# Patient Record
Sex: Male | Born: 1975 | Race: White | Hispanic: No | Marital: Single | Smoking: Current every day smoker
Health system: Southern US, Community
[De-identification: ages and names within clinical notes are randomized; demographics above are authoritative.]

## PROBLEM LIST (undated history)

## (undated) DIAGNOSIS — F32A Depression, unspecified: Secondary | ICD-10-CM

## (undated) DIAGNOSIS — G4730 Sleep apnea, unspecified: Secondary | ICD-10-CM

## (undated) DIAGNOSIS — F419 Anxiety disorder, unspecified: Secondary | ICD-10-CM

## (undated) HISTORY — PX: ABDOMINAL SURGERY: SHX537

---

## 2008-02-19 ENCOUNTER — Inpatient Hospital Stay (HOSPITAL_COMMUNITY): Admission: AD | Admit: 2008-02-19 | Discharge: 2008-02-26 | Payer: Self-pay | Admitting: *Deleted

## 2008-02-19 ENCOUNTER — Ambulatory Visit: Payer: Self-pay | Admitting: *Deleted

## 2008-02-28 ENCOUNTER — Inpatient Hospital Stay (HOSPITAL_COMMUNITY): Admission: EM | Admit: 2008-02-28 | Discharge: 2008-03-09 | Payer: Self-pay | Admitting: *Deleted

## 2008-03-12 ENCOUNTER — Inpatient Hospital Stay (HOSPITAL_COMMUNITY): Admission: AD | Admit: 2008-03-12 | Discharge: 2008-03-24 | Payer: Self-pay | Admitting: Psychiatry

## 2008-04-21 ENCOUNTER — Inpatient Hospital Stay (HOSPITAL_COMMUNITY): Admission: RE | Admit: 2008-04-21 | Discharge: 2008-05-04 | Payer: Self-pay | Admitting: Psychiatry

## 2008-04-21 ENCOUNTER — Ambulatory Visit: Payer: Self-pay | Admitting: Psychiatry

## 2008-06-25 ENCOUNTER — Inpatient Hospital Stay (HOSPITAL_COMMUNITY): Admission: EM | Admit: 2008-06-25 | Discharge: 2008-07-05 | Payer: Self-pay | Admitting: Psychiatry

## 2008-06-25 ENCOUNTER — Ambulatory Visit: Payer: Self-pay | Admitting: Psychiatry

## 2008-08-06 ENCOUNTER — Inpatient Hospital Stay (HOSPITAL_COMMUNITY): Admission: RE | Admit: 2008-08-06 | Discharge: 2008-08-08 | Payer: Self-pay | Admitting: *Deleted

## 2008-08-06 ENCOUNTER — Ambulatory Visit: Payer: Self-pay | Admitting: *Deleted

## 2008-09-02 ENCOUNTER — Inpatient Hospital Stay (HOSPITAL_COMMUNITY): Admission: RE | Admit: 2008-09-02 | Discharge: 2008-09-08 | Payer: Self-pay | Admitting: Psychiatry

## 2008-09-02 ENCOUNTER — Ambulatory Visit: Payer: Self-pay | Admitting: Psychiatry

## 2008-09-09 ENCOUNTER — Inpatient Hospital Stay (HOSPITAL_COMMUNITY): Admission: RE | Admit: 2008-09-09 | Discharge: 2008-09-15 | Payer: Self-pay | Admitting: Psychiatry

## 2008-09-09 ENCOUNTER — Ambulatory Visit: Payer: Self-pay | Admitting: Psychiatry

## 2008-09-20 ENCOUNTER — Inpatient Hospital Stay (HOSPITAL_COMMUNITY): Admission: RE | Admit: 2008-09-20 | Discharge: 2008-10-07 | Payer: Self-pay | Admitting: Psychiatry

## 2008-10-01 ENCOUNTER — Other Ambulatory Visit: Payer: Self-pay | Admitting: *Deleted

## 2008-10-04 ENCOUNTER — Other Ambulatory Visit: Payer: Self-pay | Admitting: *Deleted

## 2008-10-13 ENCOUNTER — Other Ambulatory Visit: Payer: Self-pay | Admitting: Emergency Medicine

## 2008-10-14 ENCOUNTER — Ambulatory Visit: Payer: Self-pay | Admitting: Psychiatry

## 2008-10-14 ENCOUNTER — Inpatient Hospital Stay (HOSPITAL_COMMUNITY): Admission: RE | Admit: 2008-10-14 | Discharge: 2008-10-21 | Payer: Self-pay | Admitting: Psychiatry

## 2008-10-20 ENCOUNTER — Encounter (HOSPITAL_COMMUNITY): Payer: Self-pay | Admitting: Psychiatry

## 2008-10-25 ENCOUNTER — Emergency Department (HOSPITAL_COMMUNITY): Admission: EM | Admit: 2008-10-25 | Discharge: 2008-10-26 | Payer: Self-pay | Admitting: Emergency Medicine

## 2008-12-28 ENCOUNTER — Emergency Department (HOSPITAL_COMMUNITY): Admission: EM | Admit: 2008-12-28 | Discharge: 2008-12-29 | Payer: Self-pay | Admitting: Emergency Medicine

## 2009-02-12 ENCOUNTER — Emergency Department (HOSPITAL_COMMUNITY): Admission: EM | Admit: 2009-02-12 | Discharge: 2009-02-12 | Payer: Self-pay | Admitting: Emergency Medicine

## 2009-02-24 ENCOUNTER — Emergency Department (HOSPITAL_COMMUNITY): Admission: EM | Admit: 2009-02-24 | Discharge: 2009-02-25 | Payer: Self-pay | Admitting: Emergency Medicine

## 2009-03-27 ENCOUNTER — Emergency Department (HOSPITAL_COMMUNITY): Admission: EM | Admit: 2009-03-27 | Discharge: 2009-03-31 | Payer: Self-pay | Admitting: Emergency Medicine

## 2009-05-22 ENCOUNTER — Encounter: Payer: Self-pay | Admitting: Internal Medicine

## 2009-05-23 ENCOUNTER — Encounter: Payer: Self-pay | Admitting: Internal Medicine

## 2009-05-30 ENCOUNTER — Ambulatory Visit: Payer: Self-pay | Admitting: Internal Medicine

## 2009-05-30 DIAGNOSIS — R002 Palpitations: Secondary | ICD-10-CM

## 2009-05-30 DIAGNOSIS — I10 Essential (primary) hypertension: Secondary | ICD-10-CM

## 2009-05-30 DIAGNOSIS — F411 Generalized anxiety disorder: Secondary | ICD-10-CM | POA: Insufficient documentation

## 2009-06-01 ENCOUNTER — Telehealth: Payer: Self-pay | Admitting: Internal Medicine

## 2009-06-05 ENCOUNTER — Telehealth: Payer: Self-pay | Admitting: Internal Medicine

## 2009-07-05 ENCOUNTER — Telehealth: Payer: Self-pay | Admitting: Internal Medicine

## 2009-07-25 ENCOUNTER — Telehealth: Payer: Self-pay | Admitting: Internal Medicine

## 2009-09-13 ENCOUNTER — Emergency Department (HOSPITAL_COMMUNITY): Admission: EM | Admit: 2009-09-13 | Discharge: 2009-09-16 | Payer: Self-pay | Admitting: Emergency Medicine

## 2009-09-18 ENCOUNTER — Emergency Department (HOSPITAL_COMMUNITY): Admission: EM | Admit: 2009-09-18 | Discharge: 2009-09-22 | Payer: Self-pay | Admitting: Emergency Medicine

## 2009-09-27 ENCOUNTER — Telehealth (INDEPENDENT_AMBULATORY_CARE_PROVIDER_SITE_OTHER): Payer: Self-pay | Admitting: *Deleted

## 2009-10-26 ENCOUNTER — Emergency Department (HOSPITAL_COMMUNITY): Admission: EM | Admit: 2009-10-26 | Discharge: 2009-10-28 | Payer: Self-pay | Admitting: Emergency Medicine

## 2009-11-24 ENCOUNTER — Emergency Department (HOSPITAL_COMMUNITY): Admission: EM | Admit: 2009-11-24 | Discharge: 2009-11-25 | Payer: Self-pay | Admitting: Emergency Medicine

## 2009-11-25 ENCOUNTER — Ambulatory Visit: Payer: Self-pay | Admitting: Psychiatry

## 2010-01-16 ENCOUNTER — Emergency Department (HOSPITAL_COMMUNITY): Admission: EM | Admit: 2010-01-16 | Discharge: 2010-01-17 | Payer: Self-pay | Admitting: Emergency Medicine

## 2010-02-06 ENCOUNTER — Emergency Department (HOSPITAL_COMMUNITY): Admission: EM | Admit: 2010-02-06 | Discharge: 2010-02-07 | Payer: Self-pay | Admitting: Emergency Medicine

## 2010-02-14 ENCOUNTER — Ambulatory Visit: Payer: Self-pay | Admitting: Psychiatry

## 2010-03-12 ENCOUNTER — Telehealth (INDEPENDENT_AMBULATORY_CARE_PROVIDER_SITE_OTHER): Payer: Self-pay | Admitting: *Deleted

## 2010-05-18 ENCOUNTER — Encounter (INDEPENDENT_AMBULATORY_CARE_PROVIDER_SITE_OTHER): Payer: Self-pay | Admitting: *Deleted

## 2010-06-14 ENCOUNTER — Encounter (INDEPENDENT_AMBULATORY_CARE_PROVIDER_SITE_OTHER): Payer: Self-pay | Admitting: *Deleted

## 2010-08-29 ENCOUNTER — Emergency Department: Payer: Self-pay | Admitting: Emergency Medicine

## 2010-09-02 ENCOUNTER — Encounter (HOSPITAL_COMMUNITY): Payer: Self-pay | Admitting: Psychiatry

## 2010-09-11 NOTE — Letter (Signed)
Summary: Appointment - Missed  [facility], Main Office  [address]   [place], [place] [address]   Phone: [phone]  Fax: [phone]     May 18, 2010 MRN: [id number]   [facility] [address] [place], [place]  [address]   Dear Mr. Traynham,  Our records indicate you missed your appointment on  09.26.11 with Dr. Graciela Husbands .                                    It is very important that we reach you to reschedule this appointment. We look forward to participating in your health care needs. Please contact us at the number listed above at your earliest convenience to reschedule this appointment.     Sincerely,    Glass blower/designer

## 2010-09-11 NOTE — Letter (Signed)
Summary: Appointment - Missed  [facility], Main Office  [address]   [place], [place] [address]   Phone: [phone]  Fax: [phone]     June 14, 2010 MRN: [id number]   [facility] [address] [place], [place]  [address]   Dear [name],  Our records indicate you missed your appointment on 9-26-2011with Dr. Graciela Husbands .                                   It is very important that we reach you to reschedule this appointment. We look forward to participating in your health care needs. Please contact us at the number listed above at your earliest convenience to reschedule this appointment.     Sincerely,  Lorne Skeens  [facility] Scheduling Team

## 2010-09-11 NOTE — Progress Notes (Signed)
Summary: pt having palpitations  Phone Note Call from Patient Call back at Home Phone [phone] Call back at [phone]   Caller: Patient Reason for Call: Talk to Nurse, Talk to Doctor Summary of Call: pt still having palpitations and SOB and made an appt for Sept just was wondering should he be seen sooner Initial call taken by: Omer Jack,  March 12, 2010 1:30 PM  Follow-up for Phone Call        Phone Call Completed  PT CURRENTLY TAKING PROPANOLOL ER 80 MG 1 once daily  STARTED BY PMD  C/O HR  RUNNING AT 102  THE OTHER DAY WITH LITTLE ACTIVITY  HAS APPT ON SEPT 26 ,2011 AT 12:30 INFORMED IF S/S WORSEN TO CALL OFFICE  OR GO TO NEAREST ER FOR EVALUATION AND TX. PT VERBALIZED UNDERSTANIDING. Follow-up by: Scherrie Bateman, LPN,  March 12, 2010 2:05 PM  Additional Follow-up for Phone Call Additional follow up Details #1::        increae fluid intake and will see a s scheduled Additional Follow-up by: Nathen May, MD, [facility],  March 19, 2010 1:55 PM    Additional Follow-up for Phone Call Additional follow up Details #2::    spoke with pt, he is aware of dr Koren Bound recommendations. he will call with problems prior to his appt in sept Deliah Goody, RN  March 20, 2010 9:46 AM

## 2010-09-11 NOTE — Progress Notes (Signed)
  Phone Note From Other Clinic   Caller: [facility] Details for Reason: Pt.Information Initial call taken by: KM    Faxed Holter Monitor over to [phone] [facility]  September 27, 2009 8:26 AM

## 2010-09-17 ENCOUNTER — Telehealth (INDEPENDENT_AMBULATORY_CARE_PROVIDER_SITE_OTHER): Payer: Self-pay | Admitting: *Deleted

## 2010-09-18 ENCOUNTER — Ambulatory Visit: Payer: Medicare Other | Admitting: Internal Medicine

## 2010-09-27 NOTE — Progress Notes (Signed)
  Monitor faxed to [facility] @ [phone]..call back [phone] [facility]  September 17, 2010 10:31 AM

## 2010-10-23 ENCOUNTER — Encounter (INDEPENDENT_AMBULATORY_CARE_PROVIDER_SITE_OTHER): Payer: Self-pay | Admitting: *Deleted

## 2010-10-28 LAB — BASIC METABOLIC PANEL
Calcium: 9.7 mg/dL (ref 8.4–10.5)
Creatinine, Ser: 0.9 mg/dL (ref 0.4–1.5)
Glucose, Bld: 79 mg/dL (ref 70–99)
Sodium: 141 mEq/L (ref 135–145)

## 2010-10-28 LAB — RAPID URINE DRUG SCREEN, HOSP PERFORMED
Amphetamines: NOT DETECTED
Benzodiazepines: POSITIVE — AB
Cocaine: NOT DETECTED
Opiates: NOT DETECTED
Tetrahydrocannabinol: NOT DETECTED

## 2010-10-28 LAB — CBC
MCV: 86.3 fL (ref 78.0–100.0)
Platelets: 224 10*3/uL (ref 150–400)
RBC: 5.56 MIL/uL (ref 4.22–5.81)
RDW: 13.6 % (ref 11.5–15.5)
WBC: 10.8 10*3/uL — ABNORMAL HIGH (ref 4.0–10.5)

## 2010-10-28 LAB — DIFFERENTIAL
Basophils Relative: 1 % (ref 0–1)
Lymphocytes Relative: 23 % (ref 12–46)
Monocytes Absolute: 0.9 10*3/uL (ref 0.1–1.0)
Monocytes Relative: 8 % (ref 3–12)
Neutro Abs: 7.1 10*3/uL (ref 1.7–7.7)
Smear Review: ADEQUATE

## 2010-10-28 LAB — URINALYSIS, ROUTINE W REFLEX MICROSCOPIC
Glucose, UA: NEGATIVE mg/dL
Nitrite: NEGATIVE
Protein, ur: NEGATIVE mg/dL
Specific Gravity, Urine: 1.018 (ref 1.005–1.030)
Urobilinogen, UA: 0.2 mg/dL (ref 0.0–1.0)

## 2010-10-28 LAB — ETHANOL: Alcohol, Ethyl (B): <5 mg/dL (ref 0–10)

## 2010-10-29 LAB — DIFFERENTIAL
Basophils Absolute: 0.1 10*3/uL (ref 0.0–0.1)
Basophils Relative: 1 % (ref 0–1)
Monocytes Absolute: 0.9 10*3/uL (ref 0.1–1.0)

## 2010-10-29 LAB — COMPREHENSIVE METABOLIC PANEL
ALT: 32 U/L (ref 0–53)
AST: 19 U/L (ref 0–37)
GFR calc non Af Amer: >60 mL/min (ref >60)
Potassium: 3.6 mEq/L (ref 3.5–5.1)
Sodium: 140 mEq/L (ref 135–145)
Total Bilirubin: 0.7 mg/dL (ref 0.3–1.2)

## 2010-10-29 LAB — POCT CARDIAC MARKERS
Myoglobin, poc: 48.7 ng/mL (ref 12–200)
Troponin i, poc: <0.05 ng/mL (ref 0.00–0.09)

## 2010-10-29 LAB — CBC
Hemoglobin: 15.5 g/dL (ref 13.0–17.0)
MCHC: 33.2 g/dL (ref 30.0–36.0)
RDW: 13.6 % (ref 11.5–15.5)

## 2010-10-29 LAB — LIPASE, BLOOD: Lipase: 35 U/L (ref 11–59)

## 2010-10-30 LAB — POCT I-STAT, CHEM 8
Glucose, Bld: 132 mg/dL — ABNORMAL HIGH (ref 70–99)
HCT: 48 % (ref 39.0–52.0)
Hemoglobin: 16.3 g/dL (ref 13.0–17.0)
Potassium: 3.7 mEq/L (ref 3.5–5.1)
TCO2: 22 mmol/L (ref 0–100)

## 2010-10-30 LAB — CBC
Hemoglobin: 15.1 g/dL (ref 13.0–17.0)
MCHC: 33.4 g/dL (ref 30.0–36.0)
RBC: 5.26 MIL/uL (ref 4.22–5.81)
RDW: 13.8 % (ref 11.5–15.5)

## 2010-10-30 LAB — BASIC METABOLIC PANEL
BUN: 10 mg/dL (ref 6–23)
CO2: 21 mEq/L (ref 19–32)
Calcium: 9.7 mg/dL (ref 8.4–10.5)
Creatinine, Ser: 0.88 mg/dL (ref 0.4–1.5)
GFR calc Af Amer: >60 mL/min (ref >60)
Glucose, Bld: 90 mg/dL (ref 70–99)

## 2010-10-30 LAB — URINALYSIS, ROUTINE W REFLEX MICROSCOPIC
Glucose, UA: NEGATIVE mg/dL
Ketones, ur: >80 mg/dL — AB
Protein, ur: NEGATIVE mg/dL
Specific Gravity, Urine: 1.036 — ABNORMAL HIGH (ref 1.005–1.030)

## 2010-10-30 LAB — DIFFERENTIAL
Eosinophils Absolute: 0 10*3/uL (ref 0.0–0.7)
Eosinophils Relative: 0 % (ref 0–5)
Monocytes Relative: 6 % (ref 3–12)
Neutro Abs: 10.2 10*3/uL — ABNORMAL HIGH (ref 1.7–7.7)

## 2010-10-30 LAB — RAPID URINE DRUG SCREEN, HOSP PERFORMED
Barbiturates: NOT DETECTED
Benzodiazepines: POSITIVE — AB
Cocaine: NOT DETECTED

## 2010-10-30 LAB — POCT CARDIAC MARKERS
CKMB, poc: <1 ng/mL — ABNORMAL LOW (ref 1.0–8.0)
Troponin i, poc: <0.05 ng/mL (ref 0.00–0.09)

## 2010-10-30 NOTE — Letter (Signed)
Summary: Appointment - Missed  [facility], Main Office  [address]   [place], [place] [address]   Phone: [phone]  Fax: [phone]     October 23, 2010 MRN: [id number]   [facility] [address] [place], [place]  [address]   Dear [name],  Our records indicate you missed your appointment on 09-18-2010 with Dr. Graciela Husbands.                                    It is very important that we reach you to reschedule this appointment. We look forward to participating in your health care needs. Please contact us at the number listed above at your earliest convenience to reschedule this appointment.     Sincerely,       Lorne Skeens  [facility] Scheduling Team

## 2010-10-31 LAB — BASIC METABOLIC PANEL
BUN: 16 mg/dL (ref 6–23)
CO2: 25 mEq/L (ref 19–32)
Chloride: 104 mEq/L (ref 96–112)
Potassium: 4.4 mEq/L (ref 3.5–5.1)

## 2010-10-31 LAB — COMPREHENSIVE METABOLIC PANEL
Alkaline Phosphatase: 83 U/L (ref 39–117)
BUN: 15 mg/dL (ref 6–23)
CO2: 27 mEq/L (ref 19–32)
Chloride: 103 mEq/L (ref 96–112)
Glucose, Bld: 99 mg/dL (ref 70–99)
Potassium: 3.7 mEq/L (ref 3.5–5.1)
Total Bilirubin: 0.9 mg/dL (ref 0.3–1.2)

## 2010-10-31 LAB — CBC
HCT: 46.3 % (ref 39.0–52.0)
HCT: 49 % (ref 39.0–52.0)
Hemoglobin: 17 g/dL (ref 13.0–17.0)
MCHC: 33.7 g/dL (ref 30.0–36.0)
MCV: 86 fL (ref 78.0–100.0)
Platelets: 229 10*3/uL (ref 150–400)
WBC: 11.3 10*3/uL — ABNORMAL HIGH (ref 4.0–10.5)
WBC: 13.8 10*3/uL — ABNORMAL HIGH (ref 4.0–10.5)

## 2010-10-31 LAB — RAPID URINE DRUG SCREEN, HOSP PERFORMED
Barbiturates: NOT DETECTED
Cocaine: NOT DETECTED
Cocaine: NOT DETECTED
Opiates: NOT DETECTED
Opiates: NOT DETECTED

## 2010-10-31 LAB — DIFFERENTIAL
Basophils Absolute: 0.1 10*3/uL (ref 0.0–0.1)
Basophils Relative: 1 % (ref 0–1)
Eosinophils Absolute: 0.3 10*3/uL (ref 0.0–0.7)
Eosinophils Relative: 2 % (ref 0–5)
Lymphs Abs: 3.4 10*3/uL (ref 0.7–4.0)
Monocytes Absolute: 0.9 10*3/uL (ref 0.1–1.0)
Monocytes Relative: 7 % (ref 3–12)
Neutro Abs: 9.4 10*3/uL — ABNORMAL HIGH (ref 1.7–7.7)
Neutrophils Relative %: 69 % (ref 43–77)

## 2010-10-31 LAB — CK TOTAL AND CKMB (NOT AT ARMC)
CK, MB: 0.6 ng/mL (ref 0.3–4.0)
Total CK: 69 U/L (ref 7–232)

## 2010-10-31 LAB — URINALYSIS, ROUTINE W REFLEX MICROSCOPIC
Glucose, UA: NEGATIVE mg/dL
pH: 6.5 (ref 5.0–8.0)

## 2010-10-31 LAB — ETHANOL
Alcohol, Ethyl (B): 5 mg/dL (ref 0–10)
Alcohol, Ethyl (B): <5 mg/dL (ref 0–10)

## 2010-10-31 LAB — TROPONIN I: Troponin I: <0.01 ng/mL (ref 0.00–0.06)

## 2010-11-05 LAB — RAPID URINE DRUG SCREEN, HOSP PERFORMED
Amphetamines: NOT DETECTED
Barbiturates: NOT DETECTED
Benzodiazepines: POSITIVE — AB
Cocaine: NOT DETECTED
Opiates: NOT DETECTED
Tetrahydrocannabinol: NOT DETECTED

## 2010-11-05 LAB — DIFFERENTIAL
Basophils Absolute: 0.1 10*3/uL (ref 0.0–0.1)
Basophils Relative: 1 % (ref 0–1)
Lymphocytes Relative: 25 % (ref 12–46)
Neutro Abs: 5.8 10*3/uL (ref 1.7–7.7)
Neutrophils Relative %: 64 % (ref 43–77)

## 2010-11-05 LAB — CBC
Hemoglobin: 15.1 g/dL (ref 13.0–17.0)
Platelets: 239 10*3/uL (ref 150–400)
RDW: 13.9 % (ref 11.5–15.5)

## 2010-11-05 LAB — BASIC METABOLIC PANEL
BUN: 13 mg/dL (ref 6–23)
Calcium: 9.5 mg/dL (ref 8.4–10.5)
Creatinine, Ser: 0.93 mg/dL (ref 0.4–1.5)
GFR calc non Af Amer: >60 mL/min (ref >60)
Glucose, Bld: 95 mg/dL (ref 70–99)
Sodium: 140 mEq/L (ref 135–145)

## 2010-11-05 LAB — TRICYCLICS SCREEN, URINE: TCA Scrn: NOT DETECTED

## 2010-11-17 LAB — DIFFERENTIAL
Basophils Absolute: 0 K/uL (ref 0.0–0.1)
Basophils Relative: 0 % (ref 0–1)
Eosinophils Absolute: 0.3 10*3/uL (ref 0.0–0.7)
Eosinophils Relative: 3 % (ref 0–5)
Lymphocytes Relative: 25 % (ref 12–46)
Lymphs Abs: 2.8 10*3/uL (ref 0.7–4.0)
Monocytes Absolute: 0.8 K/uL (ref 0.1–1.0)
Monocytes Relative: 7 % (ref 3–12)
Neutro Abs: 7.5 K/uL (ref 1.7–7.7)
Neutrophils Relative %: 66 % (ref 43–77)

## 2010-11-17 LAB — POCT I-STAT, CHEM 8
BUN: 14 mg/dL (ref 6–23)
Calcium, Ion: 1.15 mmol/L (ref 1.12–1.32)
Chloride: 106 mEq/L (ref 96–112)
Creatinine, Ser: 1.1 mg/dL (ref 0.4–1.5)
Glucose, Bld: 114 mg/dL — ABNORMAL HIGH (ref 70–99)
HCT: 51 % (ref 39.0–52.0)
Hemoglobin: 17.3 g/dL — ABNORMAL HIGH (ref 13.0–17.0)
Potassium: 4.1 mEq/L (ref 3.5–5.1)
Sodium: 141 mEq/L (ref 135–145)
TCO2: 26 mmol/L (ref 0–100)

## 2010-11-17 LAB — RAPID URINE DRUG SCREEN, HOSP PERFORMED
Amphetamines: NOT DETECTED
Barbiturates: NOT DETECTED
Benzodiazepines: POSITIVE — AB
Cocaine: NOT DETECTED
Opiates: NOT DETECTED
Tetrahydrocannabinol: NOT DETECTED

## 2010-11-17 LAB — URINALYSIS, ROUTINE W REFLEX MICROSCOPIC
Bilirubin Urine: NEGATIVE
Glucose, UA: NEGATIVE mg/dL
Hgb urine dipstick: NEGATIVE
Ketones, ur: NEGATIVE mg/dL
Nitrite: NEGATIVE
Protein, ur: NEGATIVE mg/dL
Specific Gravity, Urine: 1.029 (ref 1.005–1.030)
Urobilinogen, UA: 1 mg/dL (ref 0.0–1.0)
pH: 6 (ref 5.0–8.0)

## 2010-11-17 LAB — CBC
HCT: 48.5 % (ref 39.0–52.0)
Hemoglobin: 16.3 g/dL (ref 13.0–17.0)
MCHC: 33.5 g/dL (ref 30.0–36.0)
MCV: 85.9 fL (ref 78.0–100.0)
Platelets: 262 10*3/uL (ref 150–400)
RBC: 5.65 MIL/uL (ref 4.22–5.81)
RDW: 13.2 % (ref 11.5–15.5)
WBC: 11.4 10*3/uL — ABNORMAL HIGH (ref 4.0–10.5)

## 2010-11-17 LAB — ETHANOL: Alcohol, Ethyl (B): <5 mg/dL (ref 0–10)

## 2010-11-18 LAB — DIFFERENTIAL
Basophils Absolute: 0 10*3/uL (ref 0.0–0.1)
Basophils Relative: 1 % (ref 0–1)
Basophils Relative: 0 % (ref 0–1)
Eosinophils Absolute: 0.3 10*3/uL (ref 0.0–0.7)
Eosinophils Relative: 3 % (ref 0–5)
Lymphs Abs: 2.4 10*3/uL (ref 0.7–4.0)
Monocytes Absolute: 0.8 10*3/uL (ref 0.1–1.0)
Monocytes Relative: 6 % (ref 3–12)
Neutro Abs: 6.7 10*3/uL (ref 1.7–7.7)
Neutro Abs: 7.5 10*3/uL (ref 1.7–7.7)
Neutrophils Relative %: 68 % (ref 43–77)

## 2010-11-18 LAB — URINALYSIS, ROUTINE W REFLEX MICROSCOPIC
Glucose, UA: NEGATIVE mg/dL
Glucose, UA: NEGATIVE mg/dL
Hgb urine dipstick: NEGATIVE
Protein, ur: NEGATIVE mg/dL
Specific Gravity, Urine: 1.019 (ref 1.005–1.030)
pH: 7 (ref 5.0–8.0)

## 2010-11-18 LAB — COMPREHENSIVE METABOLIC PANEL
BUN: 13 mg/dL (ref 6–23)
Calcium: 9.9 mg/dL (ref 8.4–10.5)
Creatinine, Ser: 1.09 mg/dL (ref 0.4–1.5)
Glucose, Bld: 99 mg/dL (ref 70–99)
Total Protein: 8 g/dL (ref 6.0–8.3)

## 2010-11-18 LAB — RAPID URINE DRUG SCREEN, HOSP PERFORMED
Cocaine: NOT DETECTED
Cocaine: NOT DETECTED
Opiates: NOT DETECTED
Tetrahydrocannabinol: NOT DETECTED

## 2010-11-18 LAB — CBC
HCT: 51.3 % (ref 39.0–52.0)
Hemoglobin: 17.2 g/dL — ABNORMAL HIGH (ref 13.0–17.0)
Hemoglobin: 16.7 g/dL (ref 13.0–17.0)
MCHC: 33.6 g/dL (ref 30.0–36.0)
MCHC: 34 g/dL (ref 30.0–36.0)
RDW: 13.1 % (ref 11.5–15.5)
RDW: 12.9 % (ref 11.5–15.5)

## 2010-11-18 LAB — POCT I-STAT, CHEM 8
BUN: 11 mg/dL (ref 6–23)
Chloride: 107 mEq/L (ref 96–112)
HCT: 53 % — ABNORMAL HIGH (ref 39.0–52.0)
Potassium: 3.8 mEq/L (ref 3.5–5.1)
Sodium: 141 mEq/L (ref 135–145)

## 2010-11-20 LAB — DIFFERENTIAL
Basophils Relative: 1 % (ref 0–1)
Eosinophils Absolute: 0.1 10*3/uL (ref 0.0–0.7)
Eosinophils Relative: 2 % (ref 0–5)
Lymphs Abs: 2.5 10*3/uL (ref 0.7–4.0)
Monocytes Absolute: 0.6 10*3/uL (ref 0.1–1.0)
Monocytes Relative: 6 % (ref 3–12)
Neutrophils Relative %: 66 % (ref 43–77)

## 2010-11-20 LAB — CBC
HCT: 47.8 % (ref 39.0–52.0)
MCHC: 33.3 g/dL (ref 30.0–36.0)
MCV: 86.8 fL (ref 78.0–100.0)
RBC: 5.51 MIL/uL (ref 4.22–5.81)
WBC: 9.6 10*3/uL (ref 4.0–10.5)

## 2010-11-20 LAB — URINALYSIS, ROUTINE W REFLEX MICROSCOPIC
Bilirubin Urine: NEGATIVE
Glucose, UA: NEGATIVE mg/dL
Hgb urine dipstick: NEGATIVE
Protein, ur: NEGATIVE mg/dL
Urobilinogen, UA: 1 mg/dL (ref 0.0–1.0)

## 2010-11-20 LAB — BASIC METABOLIC PANEL
BUN: 8 mg/dL (ref 6–23)
CO2: 26 mEq/L (ref 19–32)
Chloride: 104 mEq/L (ref 96–112)
Creatinine, Ser: 0.85 mg/dL (ref 0.4–1.5)
GFR calc Af Amer: >60 mL/min (ref >60)
Potassium: 4 mEq/L (ref 3.5–5.1)

## 2010-11-20 LAB — RAPID URINE DRUG SCREEN, HOSP PERFORMED
Amphetamines: NOT DETECTED
Barbiturates: NOT DETECTED
Benzodiazepines: NOT DETECTED
Opiates: NOT DETECTED

## 2010-11-22 LAB — ETHANOL
Alcohol, Ethyl (B): <5 mg/dL (ref 0–10)
Alcohol, Ethyl (B): <5 mg/dL (ref 0–10)

## 2010-11-22 LAB — CREATININE, SERUM
Creatinine, Ser: 1.17 mg/dL (ref 0.4–1.5)
GFR calc Af Amer: >60 mL/min (ref >60)
GFR calc non Af Amer: >60 mL/min (ref >60)

## 2010-11-22 LAB — BASIC METABOLIC PANEL
BUN: 12 mg/dL (ref 6–23)
BUN: 12 mg/dL (ref 6–23)
CO2: 28 mEq/L (ref 19–32)
Calcium: 9.6 mg/dL (ref 8.4–10.5)
Chloride: 104 mEq/L (ref 96–112)
Creatinine, Ser: 0.86 mg/dL (ref 0.4–1.5)
GFR calc Af Amer: >60 mL/min (ref >60)
GFR calc Af Amer: >60 mL/min (ref >60)
GFR calc non Af Amer: >60 mL/min (ref >60)
Glucose, Bld: 124 mg/dL — ABNORMAL HIGH (ref 70–99)
Glucose, Bld: 96 mg/dL (ref 70–99)
Potassium: 4.1 mEq/L (ref 3.5–5.1)
Sodium: 141 mEq/L (ref 135–145)
Sodium: 138 mEq/L (ref 135–145)

## 2010-11-22 LAB — DIFFERENTIAL
Basophils Absolute: 0.1 10*3/uL (ref 0.0–0.1)
Basophils Absolute: 0.1 10*3/uL (ref 0.0–0.1)
Basophils Relative: 1 % (ref 0–1)
Eosinophils Absolute: 0.2 10*3/uL (ref 0.0–0.7)
Eosinophils Absolute: 0.1 10*3/uL (ref 0.0–0.7)
Eosinophils Relative: 2 % (ref 0–5)
Eosinophils Relative: 1 % (ref 0–5)
Lymphocytes Relative: 23 % (ref 12–46)
Lymphs Abs: 2.2 10*3/uL (ref 0.7–4.0)
Lymphs Abs: 2.5 10*3/uL (ref 0.7–4.0)
Monocytes Absolute: 0.8 10*3/uL (ref 0.1–1.0)
Monocytes Relative: 7 % (ref 3–12)
Neutro Abs: 7.6 10*3/uL (ref 1.7–7.7)
Neutrophils Relative %: 68 % (ref 43–77)

## 2010-11-22 LAB — RAPID URINE DRUG SCREEN, HOSP PERFORMED
Amphetamines: NOT DETECTED
Amphetamines: NOT DETECTED
Barbiturates: NOT DETECTED
Benzodiazepines: POSITIVE — AB
Cocaine: NOT DETECTED
Opiates: NOT DETECTED
Opiates: NOT DETECTED
Tetrahydrocannabinol: NOT DETECTED
Tetrahydrocannabinol: NOT DETECTED

## 2010-11-22 LAB — CBC
HCT: 47.9 % (ref 39.0–52.0)
HCT: 47.6 % (ref 39.0–52.0)
Hemoglobin: 15.8 g/dL (ref 13.0–17.0)
MCHC: 33.1 g/dL (ref 30.0–36.0)
MCV: 86.8 fL (ref 78.0–100.0)
MCV: 86.9 fL (ref 78.0–100.0)
Platelets: 275 10*3/uL (ref 150–400)
Platelets: 244 10*3/uL (ref 150–400)
RBC: 5.48 MIL/uL (ref 4.22–5.81)
RDW: 13.6 % (ref 11.5–15.5)
RDW: 13.4 % (ref 11.5–15.5)
WBC: 11.2 10*3/uL — ABNORMAL HIGH (ref 4.0–10.5)

## 2010-11-22 LAB — ACETAMINOPHEN LEVEL: Acetaminophen (Tylenol), Serum: <10 ug/mL — ABNORMAL LOW (ref 10–30)

## 2010-11-22 LAB — SALICYLATE LEVEL: Salicylate Lvl: <4 mg/dL (ref 2.8–20.0)

## 2010-11-27 LAB — CBC
HCT: 46.6 % (ref 39.0–52.0)
MCV: 87 fL (ref 78.0–100.0)
MCV: 87.3 fL (ref 78.0–100.0)
Platelets: 246 10*3/uL (ref 150–400)
RBC: 5.12 MIL/uL (ref 4.22–5.81)
RDW: 13.5 % (ref 11.5–15.5)
WBC: 10.1 10*3/uL (ref 4.0–10.5)
WBC: 10.2 10*3/uL (ref 4.0–10.5)

## 2010-11-27 LAB — HEPATIC FUNCTION PANEL
ALT: 26 U/L (ref 0–53)
AST: 15 U/L (ref 0–37)
Albumin: 3.7 g/dL (ref 3.5–5.2)
Total Protein: 6.8 g/dL (ref 6.0–8.3)

## 2010-11-27 LAB — URINALYSIS, ROUTINE W REFLEX MICROSCOPIC
Bilirubin Urine: NEGATIVE
Hgb urine dipstick: NEGATIVE
Protein, ur: NEGATIVE mg/dL
Urobilinogen, UA: 1 mg/dL (ref 0.0–1.0)

## 2010-11-27 LAB — DRUGS OF ABUSE SCREEN W/O ALC, ROUTINE URINE
Benzodiazepines.: NEGATIVE
Cocaine Metabolites: NEGATIVE
Methadone: NEGATIVE
Opiate Screen, Urine: NEGATIVE
Phencyclidine (PCP): NEGATIVE

## 2010-11-27 LAB — TSH: TSH: 1.134 u[IU]/mL (ref 0.350–4.500)

## 2010-11-27 LAB — COMPREHENSIVE METABOLIC PANEL
Albumin: 4.2 g/dL (ref 3.5–5.2)
BUN: 15 mg/dL (ref 6–23)
Chloride: 102 mEq/L (ref 96–112)
Creatinine, Ser: 0.96 mg/dL (ref 0.4–1.5)
Total Bilirubin: 0.7 mg/dL (ref 0.3–1.2)

## 2010-11-27 LAB — VALPROIC ACID LEVEL: Valproic Acid Lvl: 7.2 ug/mL — ABNORMAL LOW (ref 50.0–100.0)

## 2010-12-25 NOTE — Discharge Summary (Signed)
NAME:  [name], [name] NO.:  [id number]   MEDICAL RECORD NO.:  [id number]          PATIENT TYPE:  IPS   LOCATION:  0500                          [facility]ACILITY[facility]  [facility]   PHYSICIAN:  Geoffery Lyons, M.D.      DATE OF BIRTH:  Apr 30, 1976   DATE OF ADMISSION:  09/02/2008  DATE OF DISCHARGE:  09/08/2008                               DISCHARGE SUMMARY   CHIEF COMPLAINT/PRESENTING ILLNESS:  This was one of multiple admissions  to [facility] for this 35 year old single white male.  Reported his stepfather who was like his father died, and they buried  him Jan 20, 2023 before this admission.  His family dropping down to  [place] where he walked in and requested admission.  Claimed his  nerves were just all tore up to pieces, and he actually witnessed his  stepfather's death.  Stated that the father was in the hospital for lung  cancer.  Had actually just ordered hospice.  His father jumped up asking  for a gun so he could shoot his head off, and the nurses helped him back  to the bed and he died.   PAST PSYCHIATRIC HISTORY:  Multiple admissions last December 26 to  August 08, 2008.  Seeing Dr. Tomasa Rand in [place].   ALCOHOL AND DRUG HISTORY:  Denies active use of any substances.   MEDICAL HISTORY:  1. Gastroesophageal reflux.  2. Hypertension.   PHYSICAL EXAMINATION:  Failed to show any acute findings.   LABORATORY WORKUP:  Results noted in the chart.   MENTAL STATUS EXAM:  Reveals alert cooperative male appropriately  groomed, dressed, and nourished.  Speech was not pressured.  Mood:  Anxiety.  Affect:  Anxiety.  Feeling overwhelmed, underlying depression,  dealing with the death of the stepfather, feeling out of control.  No  active delusions.  No hallucinations.  No clear suicidal thoughts.  No  homicidal ideas.  Cognition well-preserved.   ADMITTING DIAGNOSES:  Axis I:  Generalized anxiety disorder.  Major  depressive disorder, recurrent.  Axis  II:  No diagnosis.  Axis III:  Hypertension.  Gastroesophageal reflux.  Axis IV:  Moderate.  Axis V:  Upon admission 35, highest global assessment of functioning in  the last year 60.   COURSE IN THE HOSPITAL:  Was admitted, started in started individual and  group psychotherapy.  He was initially maintained on his medication;  Klonopin 1 mg 4 times a day, Nexium 40 mg per day, Coreg 6.25 mg twice a  day.  As already stated he endorsed worsening of his anxiety since the  stepfather died.  Endorsed it was a very hard death, and he saw him die.  Since then  he has increased anxiety, feeling out of control.  Multiple  medication trials unsuccessful with multiple side effects.  Would like  to be considered for ECT again if he can get his Medicaid in place and  go to a place __________hospital.  January 26 still endorsing a lot of  anxiety.  Most medication have not helped, but have side effects; mostly  increased heart rate, tachycardia.  Klonopin  does help.  We tried to  modify the Klonopin in lieu of having not many other medication options  left to offer him.  January 27 he was having a hard time; anticipating  when he goes back home he would have to face the loss of the stepfather.  Felt that increasing Klonopin was helpful.  January 28 he continued to  endorse the underlying anxiety, but would like to be discharged.  There  was bad weather coming to the area, and he would rather be home and was  seen that he was not suicidal.  He felt ready to face what he had to  face at home.   DISCHARGE DIAGNOSES:  Axis I:  Generalized anxiety disorder.  Major  depressive disorder.  Axis II:  No diagnosis.  Axis III:  Hypertension.  Tachycardia.  Gastroesophageal reflux disease.  Axis IV: Moderate.  Axis V:  Upon discharge 50, 55.   Discharged on:   1. Nexium 40 mg per day.  2. Coreg 6.25 mg twice a day.  3. Klonopin 1 mg 1 in the morning, 1.5 at 12, 1.5 at 1700, 1 at      bedtime.    FOLLOWUP:  Dr. Tomasa Rand.      Geoffery Lyons, M.D.  Electronically Signed     IL/MEDQ  D:  10/05/2008  T:  10/06/2008  Job:  [id number]

## 2010-12-25 NOTE — H&P (Signed)
NAME:  [name] NO.:  [id number]   MEDICAL RECORD NO.:  [id number]          PATIENT TYPE:  IPS   LOCATION:  0505                          FACILITY:  [facility]   PHYSICIAN:  Geoffery Lyons, M.D.      DATE OF BIRTH:  04-05-1976   DATE OF ADMISSION:  10/14/2008  DATE OF DISCHARGE:                       PSYCHIATRIC ADMISSION ASSESSMENT   This is a psychiatric admission assessment regarding [name].  This is his 14th admission since February 19, 2008.  He once again presents  voluntarily.  He again reports that he is suicidal.  This time he states  that he has a plan to shoot himself with a gun.  He verbalizes feeling  like he is in chronic pain.  He states that he stays eternally in a  flight or fright response mood and this causes him constant anxiety as  well as panic attacks.  He reported that he was not able to contract for  safety at this time and hence he was readmitted   PAST PSYCHIATRIC HISTORY:  He has been with [place] on numerous occasions.  He reports numerous side effects to medications.   SOCIAL HISTORY:  He recently lost his stepfather.   CURRENTLY PRESCRIBED MEDICATIONS:  1. Coreg 6.25 mg b.i.d.  2. Seroquel 50 mg q.h.s.  3. Nexium 40 mg before breakfast.  4. Depakote ER 500 mg 2 q.h.s.  5. Multivitamin.  6. Klonopin 1 mg q.i.d.   Despite reporting that he is indeed compliant with his Depakote he has  an unusually low level.  It was less than 10.  Normal is 50-100 although  he swears he takes it.   ALLERGIES:  He reports that he broke out in a rash with LAMICTAL.   POSITIVE PHYSICAL FINDINGS:  [name] was medically cleared in the ED  at [facility].  His laboratory studies have already been  remarked on.  He had no other remarkable physical findings.  His vital  signs on admission show he is 69 inches tall.  He weighs 220 pounds.  Temperature is 97.1.  Blood pressure is 122/86, pulse of 81 and  respirations 18.  He states that the  Coreg keeps his tachycardia in  check.   MENTAL STATUS EXAM:  Today he remains alert and oriented.  He is  appropriately groomed, dressed and nourished.  He walks unaided.  He has  good eye contact.  His speech is not pressured.  He continues to have a  mismatch between what he reports as his symptoms and his affect.  He  reports that he remains anxious and irritable.  His mood was calm.  His  affect remains somewhat flat and blunted.  His thought process is  unrealistic.  He wants to be treated just for his anxiety.  His judgment  and insight are fair.  His concentration and memory are intact.  His  intelligence is average.   DIAGNOSIS:  His most recent diagnosis is bipolar disorder NOS.  This was  given to him by his outside psychiatrist, Dr. Tomasa Rand, who has  released him from service.  AXIS II:  Deferred.  AXIS III:  No known active illnesses.  AXIS IV:  Severe burden of illness.  AXIS V:  36.   PLAN:  Is to admit for safety and stabilization.  It has been suggested  several times to this patient that he consider going to the [facility]  for evaluation of his cytochrome P450 system as he seems to have unusual  responses to medications and tolerances.  He is interested in speaking  with Dr. Dub Mikes on Monday to review something that had come up on his last  admission February 20 to February 26 and to see what else they can plan.  [name] has also received ECT in the past and again reported side  effects and refused to continue with the ECT.  Estimated length of stay is 3-5 days.      Mickie Leonarda Salon, P.A.-C.      Geoffery Lyons, M.D.  Electronically Signed    MD/MEDQ  D:  10/15/2008  T:  10/16/2008  Job:  [id number]

## 2010-12-25 NOTE — Consult Note (Signed)
NAME:  KASS, HERBERGER NO.:  [id number]   MEDICAL RECORD NO.:  [id number]          PATIENT TYPE:  EMS   LOCATION:  ED                           FACILITY:  [facility]   PHYSICIAN:  Antonietta Breach, M.D.  DATE OF BIRTH:  [birth year]   DATE OF CONSULTATION:  03/28/2009  DATE OF DISCHARGE:  03/31/2009                                 CONSULTATION   REQUESTING PHYSICIAN:  [place] emergency physician.   REASON FOR CONSULTATION:  Suicidal state, anxiety.   HISTORY OF PRESENT ILLNESS:  Mr. Saadiq Poche is a 35 year old male  presenting to the [facility] on March 27, 2009, with thoughts  of killing himself.  He describes intolerable anxiety which has been  partially relieved with Klonopin.  He has chronic anhedonia and four  weeks of worsening depressed mood.  He has regular thoughts of suicide  with multiple plans.  He does not having any hallucinations or  delusions.  His orientation and memory function are intact.  He is  cooperative with the emergency department staff.   He has been taking Klonopin 1 mg q.i.d. as an outpatient, which has  partially helped his anxiety.  He states, however, today that the four  times a day has been slightly sedative for him, and that he would like  to cut back to 1 mg three times a day, with 1 milligram of Klonopin  available in the event of a panic attack.  His symptoms have been  refractory to outpatient care.   PAST PSYCHIATRIC HISTORY:  He has been tried on many psychotropic  medications in the past, including Lamictal.  He developed a rash from  the Lamictal.  He also has had an adverse reaction to Risperdal.  His  treatment has involved several SSRIs, which he could not tolerate.  He  also underwent electroconvulsive therapy with more treatment.  He states  that his condition worsened so much with the ECT, that they stopped it  prematurely.   FAMILY PSYCHIATRIC HISTORY:  He states that multiple family members have  anxiety and depression.  His uncle committed suicide.   SOCIAL HISTORY:  He has been living alone.  He is single.  He does not  use alcohol or illegal drugs.  He is unemployed and disabled, due to his  mental illness.   PAST MEDICAL HISTORY:  Gastroesophageal reflux disease.   ALLERGIES:  LAMICTAL AND RISPERDAL.   MEDICATIONS:  His MAR is reviewed.  He is on Klonopin, as described  above.   LABORATORY DATA:  WBC 11.4, hemoglobin 16.3, platelet count is 262.  Alcohol negative.  Sodium 141, BUN 14, creatinine 1.1.  Urine drug  screen was positive for benzodiazepines, which he is prescribed   REVIEW OF SYSTEMS:  PSYCHIATRIC:  Mr. Sabine was admitted to the [facility] in March 2010.  At that time, he was very  depressed with suicidal ideation and plan to shoot self with a gun.  His  medications at that time were Seroquel 50 mg q.h.s. and Depakote 1000 mg  q.h.s.  He was assessed to have  generalized anxiety disorder, with also  panic attacks.   REVIEW OF SYSTEMS:  CONSTITUTIONAL/HEAD/EYES/EARS/NOSE/THROAT/MOUTH/NEUROLOGIC:  Unremarkable.  RESPIRATORY/CARDIOVASCULAR/GENITOURINARY/SKIN/MUSCULOSKELETAL/  HEMATOLOGIC/LYMPHATIC/ENDOCRINE/METABOLIC:  Are all unremarkable.   PHYSICAL EXAMINATION:  VITAL SIGNS:  Temperature 97.9, pulse 97,  respiratory rate 20, blood pressure 142/103, O2 saturation on room air  97%.  GENERAL APPEARANCE:  Mr. Lupton is a middle-aged male, sitting up on his  hospital bed with no abnormal involuntary movements.   MENTAL STATUS EXAM:  Mr. Elko is alert.  His eye contact is intact.  His attention span is normal.  His affect is anxious.  His mood is very  depressed.  His concentration is mildly decreased.  He is oriented to  all spheres.  His memory is intact to immediate, recent and remote.  His  fund of knowledge and intelligence are within normal limits.  His speech  is mildly pressured at times.  There is no dysarthria.  Thought  process  is logical, coherent and goal-directed.  No looseness of associations.  Thought content:  He does have thoughts of killing himself.  He has  thoughts of hopelessness and helplessness.  He states that he is just  fed up.  He does not have hallucinations or delusions.  He insight is  partial.  His judgment is impaired.   ASSESSMENT:  AXIS I:  1.  Code 293.84, anxiety disorder, not otherwise  specified.  1. Depressive disorder, not otherwise specified.  AXIS II:  Deferred.  AXIS III:  See past medical history.  AXIS IV:  Primary support group.  AXIS V:  30.   DISCUSSION:  Mr. Wernette is at risk for suicide.  The undersigned  provided ego supportive psychotherapy and education.  The indications,  alternatives and adverse effects of Klonopin room were reviewed with the  patient for anti-acute anxiety benefit, including the risk of  dependence.  He understands and would like to proceed as below.   RECOMMENDATIONS:  1. Would continue Klonopin at 1 mg p.o. t.i.d. for anti-acute anxiety      and 1 mg p.o. daily p.r.n. panic.  2. Would continue low stimulation ego support.  3. Would admit to an inpatient psychiatric unit for further evaluation      and treatment.  4. Will defer other psychotropic medication changes at this time;      however, he might be a candidate for a new trial of a medication      with serotonin reuptake inhibition, combining it with pindolol, at      a low dose starting dose for both medications.  5. He also appears to be a candidate for long-term cognitive      behavioral therapy, combined with deep breathing and progressive      muscle relaxation.      Antonietta Breach, M.D.  Electronically Signed     JW/MEDQ  D:  04/01/2009  T:  04/02/2009  Job:  [id number]

## 2010-12-25 NOTE — Consult Note (Signed)
NAME:  [name], [name] NO.:  [id number]   MEDICAL RECORD NO.:  [id number]          PATIENT TYPE:  EMS   LOCATION:  ED                           FACILITY:  [facility]   PHYSICIAN:  Antonietta Breach, M.D.  DATE OF BIRTH:  [birth year]   DATE OF CONSULTATION:  10/26/2008  DATE OF DISCHARGE:                                 CONSULTATION   REASON FOR CONSULTATION:  Depression, suicidal risk, anxiety.   HISTORY OF PRESENT ILLNESS:  [name] is a 35 year old male  who has presented to the [facility] with severe  anxiety and depression with suicidal ideation and stating a plan that he  is going to use a gun to shoot himself.   Mr. Quintela describes ongoing severe catastrophic anxiety, which is  relieved with his Klonopin regimen. He also mentions that Seroquel has  had a partial response. He states that he has been frustrated, by his  account, that the dosage of Seroquel has not been titrated further,  given that he has been refractory to several other psychotropic  medication regimens.   He states that he believes his severe depression is secondary to his  anxiety. He has chronic feeling on edge and muscle tension.   He also describes anhedonia, low energy, poor concentration, and  depressed mood.   He does not have any hallucinations or delusions. He has intact  orientation and memory function. He is not combative. He is cooperative  with staff.   There are no known precipitants regarding his acute symptoms. His acute  symptoms have not responded to his current mental health regimen and he  was recently discharged from the [facility] on October 20, 2008.   PAST PSYCHIATRIC HISTORY:  [name] denies any history of suicide  attempts.   In review of the past medical record, he was admitted to the [facility] 11 times since February 20, 2008. He was just  discharged on October 20, 2008.   He had a plan to  shoot himself with a gun on October 15, 2008, when he was  last admitted to the [facility].   He was just discharged on October 20, 2008 on Seroquel XR 100 mg q.h.s.,  Clonazepam 0.5 mg q.i.d., Depakote 500 mg at bedtime.   [name] states that he has been tried on Tofranil, Doxepin, Prozac,  Paxil, Zoloft, Effexor, Lexapro, Luvox, Celexa, and Cymbalta.   FAMILY PSYCHIATRIC HISTORY:  He reports suicide, his uncle committed  suicide.   SOCIAL HISTORY:  Mr. [name] was educated through the 11th grade. He has  a GED. He is single, has no children.   He is disabled for his mental condition. He did joint the [employer] and was  medically discharged after 6 months due to flat feet.   He denies illegal drugs or alcohol.   PAST MEDICAL HISTORY:  1. Gastroesophageal reflux disease.  2. Hypertension.   MEDICATIONS:  His MAR is reviewed. He has an allergy to Lamictal. He is  not currently on any standing psychotropic medication.  LABORATORY DATA:  Aspirin negative. Sodium 141, BUN 12, creatinine 0.88,  glucose is 124. Alcohol negative. Tylenol negative. Urine drug screen  was positive for benzodiazepines (prescribed.)   WBC 10.2, hemoglobin 15.9, platelet count 275,000.   He did have an MR of the brain without contrast on October 20, 2008, which  was a normal exam.   REVIEW OF SYSTEMS:  CONSTITUTIONAL:  Head, eyes, ears, nose, throat,  mouth, neurological, psychiatric, cardiovascular, respiratory,  gastrointestinal, genitourinary, skin, musculoskeletal, hematologic,  lymphatic, endocrine, and metabolic all unremarkable.   PHYSICAL EXAMINATION:  VITAL SIGNS:  Temperature 98.7, pulse 74,  respiratory rate 18, blood pressure 129/87.  GENERAL:  Mr. Moder is a young male, appearing his chronologic age.  Partially reclined in a supine position in his hospital bed with no  abnormal involuntary movements.   MENTAL STATUS EXAM:  Mr. [name] is alert. His attention span is mildly   decreased. His eye contact is partial. His concentration is mildly  decreased. His affect is very anxious. His mood is extremely anxious. He  is completely oriented to all spheres. His memory is intact to  immediate, recent, and remote. His fund of knowledge and intelligence  are within normal limits. His speech involves normal rate and prosody  without dysarthria. Thought process is logical, coherent, goal directed,  and no looseness of association. Thought content, he continues with the  same suicidal ideation and plan as discussed above. He does not have any  hallucinations or delusions. His insight is poor. His judgment is  impaired.   ASSESSMENT:  AXIS I:    293.84 - Anxiety disorder, not otherwise  specified.              293.83 - Mood disorder, not otherwise specified, depressed.  AXIS II:    Deferred.  AXIS III:   See past medical history.  AXIS IV:    Primary support group.  AXIS V:     30   Mr. [name] is at risk to harm himself.   He continues with severe psychiatric symptoms that have been refractory  to local short term inpatient care, as well as outpatient care.   He has impaired judgment.   The undersigned provided low stimulation ego support and some education.   RECOMMENDATIONS:  Mr. [name] does understand the indications,  alternatives, and adverse effects of the following and wants to start  the following, given that they have been effective and have been the  only effective medications for anxiety, after several other trials. He  wants to proceed as below.   Proceed with Klonopin 0.5 to 1 mg q.i.d. for anti-acute anxiety,  augmented with Seroquel at 100 mg q.h.s. These two have worked together  to help his acute anxiety, which is at a catastrophic level.   Will defer other psychotropic medications to his inpatient ward  including antidepressant medication.   When giving the Klonopin, would monitor for sedation or ataxia.   When giving the Seroquel, would  monitor for any stiffness or other  extrapyramidal side effects.   Would continue to provide low stimulation ego support and would admit to  an inpatient psychiatric facility that can provide a long term inpatient  stay given that Mr. [name] has not responded to short term inpatient  stays.   Regarding his psychotherapy, Mr. Gillian could benefit from a course of  cognitive behavioral therapy, combined with deep breathing and  progressive muscle relaxation.      Antonietta Breach, M.D.  Electronically Signed  JW/MEDQ  D:  10/26/2008  T:  10/26/2008  Job:  [id number]

## 2010-12-25 NOTE — H&P (Signed)
NAMECHANLER, MENDONCA NO.:  [id number]   MEDICAL RECORD NO.:  [id number]          PATIENT TYPE:  IPS   LOCATION:  0505                          FACILITY:  [facility]   PHYSICIAN:  Geoffery Lyons, M.D.      DATE OF BIRTH:  04/14/1976   DATE OF ADMISSION:  06/25/2008  DATE OF DISCHARGE:                       PSYCHIATRIC ADMISSION ASSESSMENT   This is a voluntary admission to the services of Dr. Geoffery Lyons.   IDENTIFYING INFORMATION:  This is a 35 year old, single, white male.  This is [name]'s fifth admission with [place] this year.  He was last with  [place] September 10 to May 04, 2008.  He presented yesterday as a walk-  in.  He reported that he was suicidal with a plan to shoot himself.  He  states that he had lost interest in life, that he had not been compliant  with his meds because he believes that they are no longer working.  He  states he feels anxious; he is not sure why.  He admitted to being  depressed about a sick uncle.  Apparently after leaving [place] back in  September, [name] was admitted to [facility] where he underwent 4  ECT treatments until he could not tolerate the pain from the ECT and,  hence, they were stopped.  Earlier last week, he was admitted to the  [facility] in [place] by his private physician and apparently when  discharged on November 11 the patient did not feel any better so he  stopped his medications according to him as ordered by the physicians at  the [facility].  His family stated why he did not come back here to [facility] and, hence, here he is.   PAST PSYCHIATRIC HISTORY:  As already stated, he was just in the  [place] last week, in October he was at [facility] for ECT, and  he was last with [place] in September.  His outside psychiatrist is Dr. Smith Robert,  and his therapist is Ivin Poot.   SOCIAL HISTORY:  He has never married.  He has no children.  He lives at  home with his mother and stepfather.  He is in contact with his  biological father.  He does receive disability for his depression and  anxiety.  He went to the 11th grade but did get a GED.  He is not  currently employed.  He has another brother with mental illness and 1  uncle who completed suicide.  He denies any drug abuse.   PRIMARY CARE Yamilette Garretson:  A Dr. Wray Kearns.   MEDICAL PROBLEMS:  GERD.   MEDICATIONS:  I did call a [facility] in [place].  He is prescribed:  1. Nexium 40 mg p.o. daily.  2. Paxil 60 mg p.o. at h.s.  3. Klonopin 2 mg a.m. and h.s., 1 mg in the afternoon.  4. Wellbutrin 75 mg b.i.d.  5. Coreg 6.25 mg p.o. b.i.d.   ALLERGIES:  LAMICTAL; it gave him headaches and itching.   POSITIVE PHYSICAL FINDINGS:  Well-developed, well-nourished, white male,  who appears his stated age of 69.  The only incoming labs  were a UDS,  which were positive for benzos, but he is prescribed.  There is no  indication that he has been abusive.  His vital signs on admission show  that he is 5 feet 9, weighs 220, temperature is 97.8, blood pressure is  129/86 to 131/91, pulse 71 to 75, respirations 16.  He has no other  remarkable findings with the excepting of report inner anxiousness that  is not reflective in his affect.   MENTAL STATUS EXAM:  He is alert and oriented.  He was casually dressed,  appropriately groomed and nourished.  His speech is soft and slow.  His  mood, he reports he is anxious.  His affect is flat.  Thought processes  are clear, rational, and goal oriented.  He wants to get rid of the  anxiety.  Judgment and insight are intact.  Concentration and memory are  intact.  Intelligence is average.  He denies being suicidal or  homicidal.  He denies auditory or visual hallucinations.   DIAGNOSES:   AXIS I:  Mood disorder, not otherwise specified.   AXIS II:  Deferred.   AXIS III:  Gastroesophageal reflux disease.   AXIS IV:  Moderate chronic family stress.   AXIS V:  Currently 20.   The plan is to admit for safety and  stabilization.  Dr. Lolly Mustache had  already seen this patient and misunderstood some information and started  the patient on the low dose Librium protocol.  I told the patient that  since he was still anxious despite the amount of Klonopin he was on, we  were going to use this as an opportunity to clean out his system and  change benzodiazepines.  He wants to keep a hold on the Paxil and  Wellbutrin until tomorrow when he sees Dr. Dub Mikes.   ESTIMATED LENGTH OF STAY:  At least 3 to 5 days.      Mickie Leonarda Salon, P.A.-C.      Geoffery Lyons, M.D.  Electronically Signed    MD/MEDQ  D:  06/26/2008  T:  06/26/2008  Job:  [id number]

## 2010-12-25 NOTE — Discharge Summary (Signed)
[name], [name]              ACCOUNT NO.:  [id number]   MEDICAL RECORD NO.:  [id number]          PATIENT TYPE:  IPS   LOCATION:  0503                          FACILITY:  [facility]   PHYSICIAN:  Geoffery Lyons, M.D.      DATE OF BIRTH:  Dec 25, 1975   DATE OF ADMISSION:  04/21/2008  DATE OF DISCHARGE:  05/04/2008                               DISCHARGE SUMMARY   IDENTIFYING INFORMATION:  A 35 year old Caucasian male, single.  He is  involuntarily petitioned for transfer to [facility].   HISTORY OF PRESENT ILLNESS:  This was the fourth [facility] admission this year  for this 35 year old with a history of depression and anxiety.  He had  reported increasing problems with depression, anhedonia and getting very  anxious, worried and preoccupied since his stepfather, who lives in the  home, was diagnosed with stage III lung cancer approximately 3 weeks  prior to admission.  Had felt so anxious and worried that he had broken  off his relationship with his girlfriend and began having suicidal  thoughts about 1 week prior to admission.  Thinking of shooting himself.  He reported that he did not have immediate access to a gun but felt that  he could get one.  He had previously been treated at [facility] August 1 to  August 13 but felt that medications were not doing him any good.   PAST PSYCHIATRIC HISTORY:  Currently followed as an outpatient by Dr.  Tawanna Solo in [place], [place], and psychotherapist is Lynnae Prude, also in  [place], [place].  He reports he feels he gets little benefit from  psychotherapy.  He has had multiple trials of various antidepressants  and it is felt that they are not controlling his anxiety.  He had  reported family relationship is good and denied any prior history of  suicide attempts.  He denies a history of substance abuse, denies any  history of brain injury or learning disability.  Denies any history of  sexual abuse.   SOCIAL HISTORY:  Single Caucasian male,  never married.  No children.  Lives at home with his mother and stepfather.  His biological father  also lives in the area and he maintains contact with him.  He is on  disability for his depression and anxiety and receives a monthly check.  He has an eleventh grade education and obtained his GED.  He is not  currently employed.  He reports his parents are supportive.  He has a  brother with mental illness and one uncle who completed suicide.   MEDICAL HISTORY:  Medical problems are GERD.   ADMISSION MEDICATIONS:  1. Zoloft 100 mg p.o. q.a.m. and q.p.m.  2. Protonix 40 mg b.i.d.  3. Klonopin 2 mg q.a.m., 1 mg in the afternoon and 2 mg at night.  4. Depakote 250 mg 2 tablets q.a.m. and 3 tablets at bedtime.  5. Wellbutrin 75 mg which he has been taking b.i.d.   PHYSICAL EXAM:  Was performed and is noted in the record.  This is a  medium built, healthy Caucasian male in no distress with  recent baseline  labs within normal limits including CBC and CMET and thyroid panel was  normal.   MENTAL STATUS EXAM:  At the time of admission revealed a fully alert  male, cooperative but a bit irritable, unable to cite any acute  stressors in the week prior to admission but complaining of a rather  chronic and ongoing anxiety that is uncontrolled, having suicidal  thoughts for the previous week.  Affect and mood irritable and  depressed.  Speech normal.  Thinking linear, coherent.  No signs of  psychosis; asking for help with his anxiety, cognitively intact.   ADMITTING DIAGNOSES:  AXIS I:  Mood disorder not otherwise specified.  AXIS II:  Deferred.  AXIS III:  Gastroesophageal reflux disease.  AXIS IV:  Moderate to chronic family stress.  AXIS V:  Current 48, past year not known.   COURSE OF HOSPITALIZATION:  The patient was admitted with a goal of  controlling his anxiety and alleviating his suicidal thoughts.  He was  admitted to our mood disorders program for grief and loss programming to   treat his depression.  We initially began by increasing his Wellbutrin  to Wellbutrin XL 150 mg p.o. q. a.m. and Dacen had been rather  equivocal on whether or not he felt that this was helping him and his  Klonopin was continued at its current dose on the 12th.  We chose to  increase that to 2 mg q.a.m. and h.s. and 1 mg at 3 p.m.  He continued  to endorse that he was feeling quite anxious, mood continued to be  depressed and admitted to having suicidal thoughts that would come and  go, able to contract for safety here, denied any hallucinations.  By the  14th he was endorsing that he was still having difficulty with  persistent suicidal thoughts that would occur on most days along with  depressed mood, saying that every time he would get very anxious he  would also have significant problems with anxiety.  He was endorsing no  benefit from any of the medications and felt that possibly the  Wellbutrin was making him worse.  Therefore this was discontinued.  He  felt possibly it was affecting his sleep.  He reported no benefit from  Depakote and reported that Seroquel had caused some increase in his  liver enzymes.  Therefore we declined to try a second trial.  Given the  fact that he had had multiple medication trials he expressed interest in  pursuing electroconvulsive therapy and we made the decision to pursue  that starting September 16.  Since that time we have maintained his  current medications.  He continued to have problems with sleep while  pursuing a placement for electroconvulsive therapy.  We elected to try  doxepin 50 mg p.o. q.h.s.  He felt that he did sleep somewhat better on  that and so we have continued that medication.  We attempted to place  him in [place] where he is located for ECT but [facility]  refused to accept him since he is currently located in the state of  [place].  Trazodone, Rozerem and Ambien did not help with his  sleep.  He did  have a short episode of constipation with some irritation  of his hemorrhoids and that was treated with Anusol cream.   His discharge mental status exam reveals a fully alert gentleman,  pleasant but depressed appearing Caucasian male in no acute distress,  fully alert.  Mood and affect are depressed.  Insight adequate.  Impulse  control and judgment within normal limits.  Speech is normal, fairly  articulate, some irritability to his affect, able to give a coherent  history.  Speech normal.  Thought content primarily focusing on his own  symptoms.  Thoughts are appropriate.  Admits to continued suicidal  thoughts, wanting to possibly overdose or use a gun on himself.  Feels  that he has the means to pursue finding a weapon if he is discharged.  No evidence of psychosis.   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  AXIS II:  No diagnosis.  AXIS III:  Gastroesophageal reflux disease.  AXIS IV:  Is deferred.  AXIS V:  Current 46, past year not known.   PLAN:  Is to discharge him to [facility] to pursue ECT  and further treatment.   CURRENT MEDICATIONS:  1. At discharge Protonix 40 mg EC b.i.d.  2. Depakote 250 mg tab three at h.s. and two q. a.m.  3. Sertraline 100 mg a.m. and h.s.  4. Klonopin 2 mg q.a.m. and h.s. and 1 mg at 3:00 p.m.  5. Doxepin 50 mg p.o. q.h.s.      Margaret A. Scott, N.P.      Geoffery Lyons, M.D.  Electronically Signed    MAS/MEDQ  D:  05/04/2008  T:  05/04/2008  Job:  [id number]

## 2010-12-25 NOTE — H&P (Signed)
[name], [name]              ACCOUNT NO.:  [id number]   MEDICAL RECORD NO.:  [id number]          PATIENT TYPE:  IPS   LOCATION:  0600                          FACILITY:  [facility]   PHYSICIAN:  Anselm Jungling, MD  DATE OF BIRTH:  May 31, 1976   DATE OF ADMISSION:  03/12/2008  DATE OF DISCHARGE:                       PSYCHIATRIC ADMISSION ASSESSMENT   HISTORY:  This is a 35 year old single white male.  The patient re-  presented here at the [facility] reporting that he was  suicidal, with a plan to shoot himself.  He was just released the other  day on March 09, 2008.  That was his third admission since May.  He  states he is extremely anxious and that if he had a gun he would shoot  himself.  He has no guns at home, but says he could get one if he wanted  to.  The patient states today that he is still interested in ECT.  Once  discharged, he checked in with his psychiatrist on the outside, Dr.  Asa Saunas, who stated that ECT is usually not effective in his type of  depression and anxiety; however, the patient has so many side effects to  medications, he is asking could this be evaluated.  He states he always  has suicidal ideations, but he came back in because he could not take  it.  He was afraid he was going to explode or act on it.  He was last  with [place] on February 26, 2008, to March 09, 2008.   SOCIAL HISTORY:  He went to the 11th grade.  He does have a GED.  He has  never married.  He has no children.  He was awarded SSDI last August for  his mental condition.   FAMILY HISTORY:  He has one uncle who committed suicide.  The uncle also  suffered anxiety.   ALCOHOL AND DRUG HISTORY:  He smokes 1/2 to 1 pack of cigarettes per  day.  He has no primary care Richrd Kuzniar.  His psychiatrist is Dr. Asa Saunas  in [place], [place] and his therapist is Evelene Croon.   MEDICATIONS GIVEN WHEN DISCHARGED ON March 09, 2008:  1. Klonopin 1 mg p.o. three times daily.  2. Zoloft 100 mg in  the morning and at h.s.  3. Lopressor 25 mg twice daily.  4. Protonix 40 mg p.o. daily.   ALLERGIES:  He states that SEROQUEL in the past has elevated his liver  enzymes.  RISPERDAL increases heart rate to 150.  TRILAFON gave him  shakes; however, he could not take COGENTIN.  He has also been known to  have issues with ABILIFY AND GEODON, causing palpitations.   PHYSICAL EXAMINATION:  GENERAL:  He appears to be his stated age of 53.  He reports severe anxiety; however, his affect is not congruent nor is  his neurovegetative state.  VITAL SIGNS:  On admission show he is 70 inches tall.  He weighs 206  pounds, temperature 97.8 degrees, blood pressure 123/87 to 128/90, pulse  74 to 79, respirations 18.  MENTAL STATUS EXAM:  Today he is  alert and oriented.  He is  appropriately groomed, dressed and nourished.  His speech is soft and  slow, but otherwise normal.  His mood is actually calm.  His affect is  euthymic.  He does not reflect his reported anxiety.  Thought processes  are clear, rational and goal-oriented.  He states he needs something to  get his nerves calmed down and then he does not feel that he would be  depressed and suicidal.  Concentration and memory are intact.  Intelligence is at least average.  He reports that he is still suicidal.  He is not homicidal.  He has no auditory or visual hallucinations.   LABORATORY DATA:  No labs were drawn as he was just here and we have no  indications for that.   ADMISSION DIAGNOSES:  AXIS I.  Mood disorder, not otherwise specified:  He reports severe anxiety.  AXIS II.  Deferred.  AXIS III.  Gastroesophageal reflux disease.  AXIS IV.  Severe.  AXIS V.  30.   PLAN:  The plan is to admit for safety and stabilization.  We will have  a team conference to include the Pharmacy-D, regarding further  medication trials that would be appropriate for this patient.  We will  investigated ECT.   The estimated length of stay is four to five  days.      Mickie Leonarda Salon, P.A.-C.      Anselm Jungling, MD  Electronically Signed    MD/MEDQ  D:  03/13/2008  T:  03/13/2008  Job:  [id number]

## 2010-12-25 NOTE — H&P (Signed)
[name], [name]              ACCOUNT NO.:  [id number]   MEDICAL RECORD NO.:  [id number]          PATIENT TYPE:  IPS   LOCATION:  0506                          FACILITY:  [facility]   PHYSICIAN:  Anselm Jungling, MD  DATE OF BIRTH:  Dec 10, 1975   DATE OF ADMISSION:  09/20/2008  DATE OF DISCHARGE:                       PSYCHIATRIC ADMISSION ASSESSMENT   IDENTIFYING INFORMATION:  This is a 35 year old Caucasian male.  This is  a voluntary admission.   HISTORY OF PRESENT ILLNESS:  This is one of several [facility] admissions for  this gentleman, who presented to our lobby saying that he needed to be  hospitalized because he was suicidal with a plan to shoot himself.  He  had been discharged from our unit just a few days prior to this, and had  been transferred to [facility] for  electroconvulsive therapy.  He reports that he checked himself out  saying they did not treat me right.   PAST PSYCHIATRIC HISTORY:  This is the ninth [facility] admission since July  2009.  He has a history of depressive disorder with anxiety features;  previously followed as an outpatient by Dr. Tomasa Rand here in  [place].  He has a history of 4 ECT treatments around November 2009;  he said he could not tolerate the pain from the ECT and therefore they  were stopped.  He has also been admitted to [facility] in  [place], [place].  Prior to Dr. Tomasa Rand, he was followed by Dr. Smith Robert  and a therapist Abelino Derrick also in [place].  He has a history of  multiple medication trials.  He complains of frequently getting anxious,  a lack of sleep, a lot of muscle tightness, tension in his chest.  He  has been intolerant of multiple medications and found himself intolerant  to mood stabilizers.   SOCIAL HISTORY:  On disability for depression.  A single Caucasian male  never married.  No children.  Lives with mother and his stepfather, who  has been ill.  He previously worked in Engineering geologist.   He has an eleventh grade  education, but completed his GED.  He also has his 69 year old brother  in the home, who also suffers from depression.   PRIMARY CARE PHYSICIAN:  Dr. Wray Kearns; [place], [place].   MEDICAL HISTORY:  1. GERD.  2. Hypertension.   CURRENT MEDICATIONS:  1. Coreg 6.25 mg p.o. b.i.d.  2. Nexium 40 mg daily.  3. Klonopin 1 mg in the morning and at bedtime; 1.5 mg at noon and      5:00 p.m.   PHYSICAL EXAM:  Was done and noted in the record;  A healthy-appearing  white male with normal vital signs.  NEUROLOGIC EXAM:  Nonfocal.  No somatic complaints.  PHYSICAL:  Unremarkable.   DIAGNOSTIC STUDIES:  Were deferred, given the fact that he has just had  recent diagnostic studies within the past 30 days -- all within normal  limits.   MENTAL STATUS EXAM:  A fully alert male, pleasant and cooperative;  oriented x4.  Tells me he [facility] because he did want to be treated at a teaching hospital.  No  symptoms of psychosis.  He appears fatigued but not necessarily  depressed.  He says that he does not know what he wants.  He says  something to stabilize me until I can see my own doctor.  Memory  intact.  Speech normal.  Mood neutral.   AXIS I:  1. Depressive disorder NOS.  2. Benzodiazepine dependence.  AXIS II:  Deferred.  AXIS III:  1. GERD.  2. Hypertension.  AXIS IV:  Deferred.  AXIS V:  Current 59; past year not known.   PLAN:  Admit him to our grief and loss and depression program.  We  discussed medication strategies and discussed addressing his  benzodiazepine dependence.  He says that he cannot tolerate the Librium  or the Ativan, so we will taper the Klonopin dosage down and discuss the  trial of Seroquel XR.  We are continuing his routine medications and  will start him on Seroquel XR 50 mg p.o. q.a.m.      Margaret A. Lorin Picket, N.P.      Anselm Jungling, MD  Electronically Signed    MAS/MEDQ   D:  09/22/2008  T:  09/22/2008  Job:  [id number]

## 2010-12-25 NOTE — Discharge Summary (Signed)
NAMELITTLE, WINTON              ACCOUNT NO.:  [id number]   MEDICAL RECORD NO.:  [id number]          PATIENT TYPE:  IPS   LOCATION:  0603                          FACILITY:  [facility]   PHYSICIAN:  Jasmine Pang, M.D. DATE OF BIRTH:  1975-11-04   DATE OF ADMISSION:  02/28/2008  DATE OF DISCHARGE:  03/09/2008                               DISCHARGE SUMMARY   IDENTIFICATION:  The patient is a 35 year old Caucasian male.  This is a  voluntary admission.  He is single.   HISTORY OF PRESENT ILLNESS:  This is a readmission for this single 54-  year-old gentleman who was discharged from our unit on February 26, 2008,  after 7-day stay _____ to help stabilize his anxiety and depression.  He  says that when he went home he began to get very depressed in the  evening and felt alone.  He began to feel more anxious again.  He was  feeling unsafe and suicidal, but did not have a plan.  He asked to be  brought back to the hospital.  He denies any substance abuse.  He  reports a 10-year history of anxiety and depression.  He is previously  been started on doxepin 500 mg p.o. q.h.s. in addition to his Zoloft 100  mg p.o. b.i.d.  He felt that this was not helping him to control his  symptoms.  He reports that his family is supportive.   PAST PSYCHIATRIC HISTORY:  The patient is followed as an outpatient by  [facility] in [place], [place].  This is a second [facility] admission as indicated above.  He also had a  history of an admission to [facility] in May 2009.  He says that he has a history of anxiety and depression starting around  age 49.  He has a history of intolerance to all SNRI medications and the  SSRI.  Zoloft is the one he is tolerated the best.  He has no history of  using mood stabilizers other than lithium in the past.  It is unclear  that whether it helped him or not.  BuSpar was previously ineffective.  He had never taken gabapentin,  Lamictal, Trileptal, Topamax, or  Depakote.  He denies any history of substance abuse.  He denies any  history of abuse in childhood.  No history of brain injury.  No history  of learning disability.   FAMILY HISTORY:  He had one uncle, who committed suicide.   MEDICAL HISTORY:  Medical problems are GERD and tachycardia.   CURRENT MEDICATIONS:  1. Klonopin 1 mg p.o. t.i.d.  2. Zoloft 100 mg a.m. and h.s.  3. Lopressor 25 mg b.i.d.  4. Protonix 40 mg daily.  5. Doxepin 50 mg q.h.s., which was started during the last admission      as indicated above.  He did not feel this had been helpful for his      anxiety.   DRUG ALLERGIES:  He does state he had a history of palpitations with  GEODON and ABILIFY.   PHYSICAL FINDINGS:  The patient was well-nourished  and well-developed 29-  year-old male with a normal physical exam.  There were no acute physical  or medical problems noted.   ADMISSION LABORATORIES:  The patient's urine drug screen was positive  for benzodiazepines.  His previous CMET the week before was within  normal limits as was his TSH.  His previous THS was 1.707.  Last week,  his CBC and metabolic panel were all within normal limits.   HOSPITAL COURSE:  Upon admission, the patient was restarted on his  Zoloft 100 mg b.i.d., Klonopin 1 mg t.i.d., Lopressor 25 mg b.i.d.,  Protonix 40 mg daily, Ambien 10 mg p.o. q.h.s. p.r.n. insomnia, and  doxepin 50 mg p.o. q.h.s.  Initially, he was reserved and lethargic.  He  stated that he did not feel like his medications were working including  the doxepin that had just been started last admission.  He states he  became very anxious again and was having panic attacks.  He had gone  back to live with his family who he does describe is supportive.  He is  on disability due to his psychiatric problems.  On February 29, 2008,  doxepin was discontinued.  Instead, he was started on gabapentin 100 mg  p.o. q.i.d. and Lamictal 25 mg p.o. q.day.   He was still depressed and  anxious with positive suicidal ideation, but he contract for safety.  He  was worried about whether he was going to get better.  Side effects  included sedation.  On March 02, 2008, sleep was good and appetite was  improving.  There was still some suicidal ideation and depression.  On  March 04, 2008, Neurontin was discontinued due to sedation on this  medication.  His Lamictal was increased to 50 mg p.o. q.day.  On March 05, 2008, he was given Fioricet 2 pills p.o. x1 p.r.n. headache.  On  March 08, 2008, mood was improving.  He became less depressed and less  anxious.  He began to talk about wanting to go home.  On March 09, 2008,  the patient's mental status had improved markedly from admission status.  His mood was euthymic.  Affect wide range.  There was no suicidal or  homicidal ideation.  No thoughts of self-injurious behavior.  No  auditory or visual hallucinations.  No paranoia or delusions.  Thoughts  were logical and goal directed.  Thought content, no predominant theme.  Cognitive was grossly intact.  Insight was fair.  Judgment was good.  Impulsivity was notable.  It was felt, the patient was safe for  discharge today.   DISCHARGE DIAGNOSES:  Axis I:  Mood disorder, not otherwise specified.  Anxiety disorder, not otherwise specified.  Axis II:  None.  Axis III:  Gastroesophageal reflux disease and tachycardia.  Axis IV:  Severe (issues with managing depressive symptoms, burden of  other psychosocial problems, and medical issues).  Axis V:  Global assessment of functioning was 50 upon discharge.  GAF  was 40 upon admission.  GAF highest past year was 65.   DISCHARGE PLANS:  There was no specific activity level or dietary  restrictions.   POSTHOSPITAL CARE PLANS:  The patient will be seen at the [facility] on August 3rd at 3 p.m. for therapy.  He  will also be seen at this Center on August 28th at 1 p.m. for  medication  management.      Jasmine Pang, M.D.  Electronically Signed  BHS/MEDQ  D:  03/09/2008  T:  03/10/2008  Job:  [id number]

## 2010-12-25 NOTE — Discharge Summary (Signed)
[name], PLATNER              ACCOUNT NO.:  [id number]   MEDICAL RECORD NO.:  [id number]          PATIENT TYPE:  IPS   LOCATION:  0505                          FACILITY:  [facility]   PHYSICIAN:  Geoffery Lyons, M.D.      DATE OF BIRTH:  12/01/1975   DATE OF ADMISSION:  06/25/2008  DATE OF DISCHARGE:  07/05/2008                               DISCHARGE SUMMARY   CHIEF COMPLAINT AND PRESENT ILLNESS:  This was one of multiple  admissions to [facility] for this 35 year old single  white male.  He was last admitted April 21, 2008, to May 04, 2008.  He presented as a walk in.  Reported that he was suicidal with a  plan to shoot himself.  He has lost interest in life.  He has not been  compliant with his medication.  He believes that they are no longer  working.  He feels anxious, not sure why.  He has been depressed about a  sick uncle.  After leaving Behavioral Health in September, he was  admitted to [facility] where he underwent for ECT treatment.  He  endorsed he could not tolerate the pain from ECT anymore day, they  stopped.  Earlier last week, he was admitted to the [facility] by his  private physician.  He was discharged June 22, 2008.  He did not  feel any better so he stopped his medication and then June 25, 2008,  he came back to [place].   PAST PSYCHIATRIC HISTORY:  Multiple admissions to [facility] and and to [facility] in [facility] and [facility].  Multiple medication trials.   ALCOHOL AND DRUG HISTORY:  Denies active use of any substances.   MEDICAL HISTORY:  Gastroesophageal reflux disease.   MEDICATION:  1. Nexium 20 mg per day.  2. Paxil 60 mg at night.  3. Klonopin 2 mg in the morning and at night and 1 mg in the afternoon      as needed.  4. Wellbutrin 75 mg twice a day.  5. Coreg 6.25 mg per day.   PHYSICAL EXAM:  Failed to show any acute findings.   LABORATORY WORKUP:  Results not in the chart.   MENTAL STATUS EXAM:  Reveals alert, cooperative male.  Mood depressed.  Affect depressed.  Anxious.  Thought processes logical, coherent, and  relevant.  Endorsed he wants to get rid of the anxiety, wants to feel  better.  Suicide ruminations due to the way he is feeling.  No homicidal  ideas.  No delusions.  Cognition well preserved.   ADMITTING DIAGNOSES:  AXIS I:  1. Generalized anxiety disorder.  2. Major depression, recurrent.  AXIS II:  No diagnosis.  AXIS III:  Gastroesophageal reflux.  AXIS IV:  Moderate.  AXIS V:  Per admission 35, highest Global Assessment of Functioning in  the last year of 60.   COURSE IN THE HOSPITAL:  Was admitted, started individual and group  psychotherapy.  As already stated, multiple trials with medications.  Multiple episodes of different side effects.  Since discharged from the  unit, went to [facility], then went to another inpatient unit.  Felt medications were not working.  The depression better controlled.  Endorsed anxiety.  Was having a lot of physical symptoms from the ECT so  he quit them.  Wants something for his anxiety, tried multiple  medications.  We went ahead and resumed the Klonopin.  He had tried MAO  inhibitors, Prozac, Zoloft, Paxil, Celexa, Lexapro, Effexor, Cymbalta,  Wellbutrin, Elavil, Norpramin, Tofranil, Luvox, Sinequan, Abilify,  Seroquel, Zyprexa, Risperdal, Klonopin, Valium, Tranxene, Xanax, Ativan,  Neurontin, Depakote, Lamictal, Gabitril, and lastly ECT.  We went ahead  and tried nortriptyline/Pamelor.  He was aware that there were not a lot  of options.  We started the Pamelor and slowly increased the dose  June 29, 2008, he had tolerated the Pamelor okay.  Willing to give  the medication a try.  Endorsed that he was dealing with his  grandfather's illness and that was affecting his ability to function.  On June 30, 2008, he continued to tolerate the Pamelor well so far  but he was earlier sedated, we  need to give it a longer trial.  He  continued to work well with the [employer] coping skills CBT.  There was  some sedation that he was willing to put up with, is not sold on the  idea that Pamelor was going to work.  July 02, 2008, was having some  issues with increased heart rate, worried that this could be a side  effect to the [employer].  On July 03, 2008, having increased  tachycardia secondary to the tricyclics.  We went and discharged the  Pamelor as he has been very sensitive to all these tricyclics and he was  already thinking that it was not going to help.  As soon as we  discontinued the Pamelor, the heart rate went back to normal.  He was  anxious and aware that we have tried mostly every single medication that  there was to be tried and he had to try alternate ways of dealing with  the anxiety.  By July 05, 2008, he was endorsing no suicidal or  homicidal ideations.  Had done some work on self and coping skills,  cognitive therapy, had accepted the fact that medication was not the  answer.  He was not suicidal.  We went ahead and discharged to  outpatient followup.   DISCHARGE DIAGNOSES:  AXIS I:  1. Major depressive disorder.  2. Generalized anxiety disorder.  AXIS II:  No diagnosis.  AXIS III:  Gastroesophageal reflux disease.  AXIS IV:  Moderate.  AXIS V:  Upon discharge 50.   Discharged on:  1. Coreg 6.25 mg twice a day.  2. Klonopin 1 mg 4 times a day.  3. Nexium 40 mg per day.   FOLLOW UP:  [facility].      Geoffery Lyons, M.D.  Electronically Signed     IL/MEDQ  D:  08/03/2008  T:  08/04/2008  Job:  [id number]

## 2010-12-25 NOTE — H&P (Signed)
[name], [name]              ACCOUNT NO.:  [id number]   MEDICAL RECORD NO.:  [id number]          PATIENT TYPE:  IPS   LOCATION:  0406                          FACILITY:  [facility]   PHYSICIAN:  Vic Ripper, P.A.-C.DATE OF BIRTH:  February 20, 1976   DATE OF ADMISSION:  09/02/2008  DATE OF DISCHARGE:                       PSYCHIATRIC ADMISSION ASSESSMENT   This is a voluntary admission to the services of Dr. Geralyn Flash.   IDENTIFYING INFORMATION:  This is a 35 year old, single, white male.  This is one of numerous admissions to the [facility] for  this gentleman.  He reports that his stepfather, who was like his  father, died this past week and they buried him Thursday.  His family  drove him down here Friday where he walked in and requested admission.  He stated his nerves are just all tore up to pieces as he actually  witnessed his stepfather's death.  He stated that the father was in the  hospital for lung cancer.  They had actually just ordered Hospice.  His  father jumped up asking for a gun so he could shoot his head off, and  the nurses helped him back to bed and he died.   PAST PSYCHIATRIC HISTORY:  He was last with [place] December 26 to August 08, 2008.  He does have an upcoming appointment with his outside  psychiatrist, Dr. Tomasa Frye, on February the 3rd.   SOCIAL HISTORY/FAMILY HISTORY:  Unchanged.  Please see old chart.   ALCOHOL AND DRUG HISTORY:  He denies any issues with alcohol or drug  history.   MEDICAL PROBLEMS:  1. GERD.  2. Hypertension.   DRUG ALLERGIES:  He stated that LAMICTAL gave him headaches and itching.   POSITIVE PHYSICAL FINDINGS:  He is a well-developed, well-nourished  male, who appears his stated age of 73.  As he has so recently been an  inpatient, his labs were not repeated.  He does not have any specific  complaints today other than his usual headache.  His vital signs show he  is 176 cm tall, he weighs 220.5 pounds,  temperature is 98.4, blood  pressure was 127/90, pulse 72, respirations 18.   MENTAL STATUS EXAM:  Today, he was easily aroused.  He was in bed  sleeping.  He was appropriately groomed, dressed, and nourished.  His  speech was not pressured.  His mood was actually not too bad.  His  affect was somewhat depressed.  His thought processes are clear,  rational, and goal oriented.  Of course, he still complains about  anxiety.  Judgment and insight are fair.  Concentration and memory are  intact.  Intelligence is at least average.  He is not actively suicidal.  He was just so anxious that he felt he needed to come in, and he is not  homicidal and he is not having auditory or visual hallucinations.   DIAGNOSES:   AXIS I:  1. Anxiety disorder, not otherwise specified.  2. Major depressive disorder, recurrent, severe.  3. INTOLERANT OF ANTIDEPRESSIVE MEDICATIONS.   AXIS II:  Deferred.   AXIS III:  1. Gastroesophageal  reflux disease.  2. Hypertension.   AXIS IV:  Moderate to severe burden of illness and stepfather's death.   AXIS V:  Global Assessment of Functioning is 60.   The plan is to admit for safety and stabilization.  We will adjust his  meds if we need to.  Otherwise, we will just continue his meds, which  are Nexium 40 mg p.o. daily, Klonopin 1 mg p.o. q.i.d., Carvedilol 6.25  mg p.o. b.i.d., and he is considering another round of ECT if he can  find a hospital where they would treat his pain.  He states that he  stopped ECT at [facility] due to severe muscle pain and he could  not get anybody to treat his discomfort.   ESTIMATED LENGTH OF STAY:  Just 2 to 3 days.      Vic Ripper, P.A.-C.     MD/MEDQ  D:  09/03/2008  T:  09/03/2008  Job:  [id number]

## 2010-12-25 NOTE — H&P (Signed)
Peter, Frye NO.:  [id number]   MEDICAL RECORD NO.:  [id number]          PATIENT TYPE:  IPS   LOCATION:  0505                          FACILITY:  BH   PHYSICIAN:  Geoffery Lyons, M.D.      DATE OF BIRTH:  1976/07/17   DATE OF ADMISSION:  04/21/2008  DATE OF DISCHARGE:                       PSYCHIATRIC ADMISSION ASSESSMENT   IDENTIFYING INFORMATION:  This is a 35 year old Caucasian male who is  single.  This is a voluntary admission.   HISTORY OF PRESENT ILLNESS:  This is the 4th [facility] admission this year for  this 35 year old with a history of depression and anxiety.  He currently  is living with his mother and stepfather in the [place], [place], area  and says that he has been increasingly stressed since his stepfather was  diagnosed with stage III lung cancer about 3 weeks ago.  Since that  time, he has been increasingly anxious, very preoccupied with his  father's illness.  The whole family has been quite concerned about him.  Apparently, there are problems with the stepfather's insurance not  covering his treatment that was recommended.  He reports that taking  medications as prescribed but getting increasingly anxious.  He denies  any overt panic getting quite preoccupied with thoughts of his  stepfather, is feeling bad about his condition and his current  situation.  He began having some suicidal thoughts about a week ago with  thoughts of possibly shooting himself.  He does not have immediate  access to a gun but says that he can get one; denies any substance  abuse.  He says that he has been depressed and anxious enough that he  went ahead and broke up with his girlfriend and could not handle the  relationship with the stress of everything else going on; denies any  substance abuse; denies any homicidal thoughts.   PAST PSYCHIATRIC HISTORY:  Fourth [facility] admission previously discharged  March 12, 2008, to March 24, 2008, and at that time was  discharged on  Zoloft 100 mg twice a day, Gabitril 2 mg twice a day and 4 mg at  bedtime, Klonopin 1 mg 3 times daily, Seroquel XR 200 mg at bedtime and  Protonix for his GERD.  He endorses a long history of anxiety and  depression with a strong family history of the same.  He denies any  history of substance abuse, and he denies any prior suicide attempts.  The patient is currently followed by Dr. Tawanna Solo, MD, in [place], [place], and his psychotherapist is Lynnae Prude also in [place],  [place].   SOCIAL HISTORY:  Single Caucasian male never married.  No children.  Lives with his mother and stepfather.  His biological father also lives  in the area, and he does maintain contact with him.  He is on disability  and receives a monthly check.  He has 11th grade education.  He has a  GED.   FAMILY HISTORY:  Remarkable for 1 uncle who committed suicide.   MEDICAL HISTORY:  Medical problem is GERD.   CURRENT MEDICATIONS:  1. Zoloft 100 mg  a.m. and nightly, Protonix 40 mg b.i.d., Klonopin 2      mg q.a.m., 1 mg in the afternoon and 2 mg nightly; Depakote 250 mg      2 tablets q.a.m., 3 tablets at bedtime; Wellbutrin 75 mg which he      has been taking every morning and at bedtime.   PHYSICAL EXAMINATION:  Was done and is noted in the record.  This is a  healthy Caucasian male who is in no distress.  No remarkable findings.  He is 5 feet 9 inches tall, 201 pounds, temperature 97.3, pulse 73,  respirations 18, blood pressure 121/80.   LABORATORIES:  Currently pending.   MENTAL STATUS EXAM:  Fully alert male, cooperative, a little bit  irritable, not wanting to participate a lot in the interview, feels he  has been asked the same questions several times, is unable to cite any  specific stressors this week or any particular reason why he decided to  come to the hospital this week versus a week or 2 weeks ago.  He says  that this is an ongoing problem; the anxiety is nothing new.   He does  have concerns about taking the Wellbutrin and if this is the appropriate  medication for him.  Speech is normal.  Mood is a little bit irritable.  Mood is depressed.  He does not appear to be particularly anxious,  although he is describing feeling that his muscles are very tense,  denying having difficulty sleeping, feels a lot of tension in his body,  sweating in his palms, although skin is dry today.  Thought process:  Logical, coherent.  Insight seems adequate.  He is endorsing vague  suicidal thoughts today, claiming that he has access to a gun.  Cognitively, he is intact.  He is denying any homicidal thoughts.  No  evidence of psychosis, no paranoia, no guarding, no delusional  statements.   AXIS I:  Mood disorder, not otherwise specified.  AXIS II:  Deferred  AXIS III:  Gastroesophageal reflux disease.  AXIS IV:  Moderate chronic family stress.  AXIS V:  Current 48.  Past year not known.   PLAN:  Voluntarily admit with every 15-minute checks in place.  He is in  our depression management program and will continue his current  medications with the exception that we have recommended that he move his  Wellbutrin doses to.  q.a.m. and 3:00 p.m.      Margaret A. Scott, N.P.      Geoffery Lyons, M.D.  Electronically Signed    MAS/MEDQ  D:  04/22/2008  T:  04/23/2008  Job:  [id number]

## 2010-12-25 NOTE — H&P (Signed)
[name], [name] NO.:  [id number]   MEDICAL RECORD NO.:  [id number]          PATIENT TYPE:  IPS   LOCATION:  0603                          FACILITY:  [facility]   PHYSICIAN:  Jasmine Pang, M.D. DATE OF BIRTH:  January 10, 1976   DATE OF ADMISSION:  02/28/2008  DATE OF DISCHARGE:                       PSYCHIATRIC ADMISSION ASSESSMENT   IDENTIFICATION:  A 35 year old Caucasian male.  This is a voluntary  admission.  He is single.   HISTORY OF PRESENT ILLNESS:  This is a readmission for this single 31-  year-old gentleman who was discharged from our unit on February 26, 2008,  after a 7-day stay to stabilize his anxiety and depression.  He says  that he went home and got very depressed in the evening, felt very  alone, was getting very anxious, feeling unsafe and suicidal but did not  have a plan and asked to be brought back to the hospital.  He denies any  substance abuse.  Reports a 10-year history of anxiety and depression.  Had previously been started on doxepin 50 mg q.h.s. in addition to his  Zoloft 100 mg b.i.d. and feels it is not helping to control his  symptoms.  He denies any substance abuse or alcohol use.  He says his  family is supportive.   PAST PSYCHIATRIC HISTORY:  Followed as an outpatient by [facility] in [place], [place].  The second  [facility] admission.  He also has a history of admission to a [place]  psychiatric hospital in May 2009.  Says that he has a history of anxiety  and depression starting around age 44.  He has a history of intolerance  to all SNRI medications, and the SSRI Zoloft he has tolerated the best.  He has no history of using mood stabilizers other than lithium in the  past and he is unclear that it helped him.  BuSpar was previously  ineffective.  Has never taken gabapentin, Lamictal, Trileptal, Topamax  or Depakote.  He denies any history of substance abuse.  He denies any  history of abuse in  childhood.  No history of brain injury.  No history  of learning disability.   SOCIAL HISTORY:  Never married.  No children.  Says he does have a  girlfriend who is supportive.  Living at home with his 26 year old  brother, his mother and his stepfather, all of whom are supportive.  He  is on disability since August 2008 for mental illness.  Has an eleventh  grade education and completed a GED.  He has not worked in more than a  year.  Previously worked in Engineering geologist.  No legal charges.  He has no  children.   FAMILY HISTORY:  One uncle who committed suicide.   MEDICAL HISTORY:  Medical problems are GERD and tachycardia.   CURRENT MEDICATIONS:  1. Klonopin 1 mg t.i.d.  2. Zoloft 100 mg a.m. and h.s.  3. Lopressor 25 mg b.i.d.  4. Protonix 40 mg daily.  5. Doxepin 50 mg q.h.s. was started at the last admission.   DRUG ALLERGIES:  None.  He  does have a history of palpitations with  Geodon and Abilify.   POSITIVE PHYSICAL FINDINGS:  Well-nourished, well-developed 35 year old  with a normal motor exam.  His full physical exam is noted in the  record.   REVIEW OF SYSTEMS:  Essentially negative.   VITAL SIGNS:  Temperature 96.6, pulse 82, respirations 18, blood  pressure 101/71.  He is 94.8 kg and 5 feet 9 inches tall.   DIAGNOSTIC STUDIES:  Currently pending.  His previous CMET last week was  within normal limits, as was his TSH.  His previous TSH was 1.707.  Last  week his CBC and metabolic panel were all within normal limits.  Urine  drug screen is currently pending.   ALCOHOL AND DRUG HISTORY:  Note that he does smoke about 1-1/2 packs of  cigarettes daily.   MENTAL STATUS EXAM:  Fully alert gentleman, calm and coherent, in no  distress.  Feels his medications are not working.  Having episodes of  increased anxiety.  Speech is normal.  Insight is good.  He is fairly  articulate, can give a coherent history and has a good memory for his  multiple medications.  Has never been  treated with ECT in the past.  Mood is depressed.  Thought process is logical, coherent.  Positive for  passive suicidal thoughts without a clear plan.  No homicidal thought.  No evidence of psychosis.  Cognition is fully preserved.   AXIS I:  1.  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  2.  Anxiety not otherwise specified.  AXIS II:  Deferred.  AXIS III:  1.  Gastroesophageal reflux disease.  2.  Tachycardia.  AXIS IV:  Severe issues with managing depressive symptoms.  AXIS V:  Current 40, past year 65 estimated.   PLAN:  To voluntarily admit him, to alleviate his anxiety and suicidal  thoughts.  We are going to discontinue his doxepin 50 mg q.h.s. and  start him on gabapentin 100 mg p.o. q.i.d., and we discussed the  possible use of Lamictal to control his depressive episodes.  We are  going to give him some information today and discuss the risks and  benefits of Lamictal.  Meanwhile, we will get a urine drug screen and  give him some written information on Lamictal, and he has expressed a  desire to follow up with Dr. Tiajuana Amass here in [place].      Margaret A. Lorin Picket, N.P.      Jasmine Pang, M.D.  Electronically Signed    MAS/MEDQ  D:  02/29/2008  T:  02/29/2008  Job:  [id number]

## 2010-12-25 NOTE — Discharge Summary (Signed)
NAME:  LYDIA, MENG NO.:  [id number]   MEDICAL RECORD NO.:  [id number]          PATIENT TYPE:  IPS   LOCATION:  0600                          FACILITY:  [facility]   PHYSICIAN:  Jasmine Pang, M.D. DATE OF BIRTH:  1976-03-23   DATE OF ADMISSION:  02/19/2008  DATE OF DISCHARGE:  02/26/2008                               DISCHARGE SUMMARY   IDENTIFICATION:  This is a voluntary admission for this 35 year old  single white male who was admitted on February 19, 2008.   HISTORY OF PRESENT ILLNESS:  The patient presented yesterday as a walk-  in to the [facility].  He reported he was having  severe anxiety, depression, and suicidal ideation.  He has been  diagnosed with anxiety and depression since the age of 65.  He is  currently an outpatient treatment in [place].  He also reports severe  constant anxiety for the past week including muscle tension,  tremulousness, racing heart.  His depressive symptoms include insomnia,  poor appetite, feelings of hopelessness, and helplessness.  On the day  of admission, he told his family members that he had been having  suicidal thoughts with the urge to acquire a gun and shoot himself.  His  family immediately brought him to the [facility] after he  disclosed these thoughts.   PSYCHIATRIC HISTORY:  The patient's last admission was in May 2009, at  the [facility] in [place].  He has had numerous admissions in the  past 10 years.  Currently an outpatient therapy with Dr. Elpidio Anis and  therapist, Abelino Derrick.  Two years ago he had his first panic attacks and  since then he has had a variety of medication trials.  The medications  are some time help and sometimes they did not.  The SNRIs also increase  his heart rate.   FAMILY HISTORY:  The patient has one uncle who committed suicide.   ALCOHOL AND DRUG HISTORY:  The patient smokes one and half to one pack a  day.   MEDICAL PROBLEMS:  The patient has  GERD.   MEDICATIONS:  The patient is currently prescribed:  1. Zoloft 100 mg p.o. b.i.d.  2. Klonopin 1 mg t.i.d.  3. Lopressor 25 mg b.i.d., but this is to control his heart rate not      hypertension.  (He does not have hypertension).  4. Protonix 40 mg p.o. q. day.   DRUG ALLERGIES:  He has no known drug allergies.   PHYSICAL FINDINGS:  There were no acute physical or medical problems  noted.  The patient had no acute medical or physical problems noted.   HOSPITAL COURSE:  Upon admission, the patient was started on Zoloft 100  mg b.i.d. and Lopressor 25 mg q.a.m. and h.s., Klonopin 1 mg t.i.d. and  Protonix 40 mg q. day as well as Ambien 10 mg q.h.s.  On February 20, 2008,  in individual session with me, the patient was reserved, but friendly  and cooperative.  He intermittently participated in unit therapeutic  groups and activities, but sometimes stayed in bed during  groups.  He  was complaining of severe anxiety.  He states he had been thinking of  buying a gun to shoot himself.  He was started on doxepin 10 mg p.o.  t.i.d. and 50 mg at h.s. in addition to his other medications.  He also  on February 21, 2008, there were some middle of the night awakening.  Appetite was improving.  He still had positive suicidal ideation, but  could contract for safety.  He had complained of side effects including  feeling cloudy headed and dry mouth.  He had some hopelessness.  He  was worried he would not get better.  On February 23, 2008, the patient's  doxepin was decreased to only doxepin 50 mg p.o. q.h.s., the daytime  doses were eliminated.  He began to feel better.  He no longer had the  side effects of feeling sedate.  He stated his mother was very  supportive.  He discussed his family history of psychiatric problems  including his uncle who had committed suicide.  By February 25, 2008, his  sleep was good.  Appetite was good.  Mood was improving.  On February 26, 2008, mental status had improved  markedly from admission status.  The  patient was less depressed, less anxious.  There were no suicidal or  homicidal ideation.  Thoughts were logical and goal-directed.  Thought  content, no predominant theme.  Cognitive was grossly intact.  There was  no auditory or visual hallucinations.  No paranoia or delusions.  It was  felt to be safe to be discharged today.   DISCHARGE PLANS:  There was no specific activity level or dietary  restriction.   POSTHOSPITAL CARE PLANS:  The patient will be seen at the [facility] on February 29, 2008, at 10 o'clock for medication  management.  He will also be seen for therapy at [facility] on February 29, 2008, at 12 o'clock.   DISCHARGE MEDICATIONS:  1. Zoloft 100 mg in the morning and at bedtime.  2. Lopressor 25 mg the a.m. and bedtime.  3. Protonix 40 mg daily.  4. Doxepin 50 mg at bedtime.  5. Clonazepam 1 mg at 8:00 a.m., 1:00 p.m., and bedtime.  6. Ambien 10 mg at bedtime p.r.n., insomnia.      Jasmine Pang, M.D.  Electronically Signed     BHS/MEDQ  D:  02/27/2008  T:  02/27/2008  Job:  [id number]

## 2010-12-25 NOTE — H&P (Signed)
NAME:  [name], [name] NO.:  [id number]   MEDICAL RECORD NO.:  [id number]          PATIENT TYPE:  IPS   LOCATION:  0302                          FACILITY:  [facility]   PHYSICIAN:  Jasmine Pang, M.D. DATE OF BIRTH:  August 23, 1975   DATE OF ADMISSION:  08/06/2008  DATE OF DISCHARGE:                       PSYCHIATRIC ADMISSION ASSESSMENT   IDENTIFYING INFORMATION:  The patient presented as a walk-in yesterday,  reporting an increase in his depression, anxiety, and suicidal ideation.  He has a long history for depression.  He is currently in outpatient  treatment with Dr. Tiajuana Amass.  He feels himself increasingly  depressed and anxious this past week.  He also reported having had  suicidal ideation with thoughts of acquiring a gun to shoot himself.  He  states that recent stressors are a relationship break-up with a  girlfriend, the holidays have always been stressful, and his step-father  has terminal cancer.  He is requesting inpatient treatment because he  does not feel safe.   PAST PSYCHIATRIC HISTORY:  He has had a number of prior admissions here  to the [facility].   SOCIAL HISTORY:  He is unmarried.  He is not working.  Other than that  please see prior dictation.   ALCOHOL AND DRUG HISTORY:  Negative.   PRIMARY CARE Royce Sciara:  Dr. __________ in [place].   PSYCHIATRIST:  Dr. Tiajuana Amass here in [place].   His [place] will expire and he is due to begin Medicare  coverage on February 1.  He does have an upcoming appointment, February  3, with Dr. Tomasa Rand.   MEDICAL PROBLEMS:  Reflux.   MEDICATIONS:  He reports that he is currently prescribed:  1. Aciphex 20 mg at 6 a.m.  2. Klonopin 1 mg 8 a.m., 12 noon, and 5 p.m. and hour of sleep.  3. Coreg 6.25 mg 8 a.m. and 5 p.m.  4. Multivitamin every morning.   DRUG ALLERGIES:  LAMICTAL gave him a headache and he itched.   POSITIVE PHYSICAL FINDINGS:  GENERAL:   Well-developed, well-nourished  male who appears his stated age of 21.  He has no acute symptoms or  findings.  VITAL SIGNS:  Show he is 69-1/4 inches tall, weighs 220, temperature is  97.1, blood pressure 140/91, pulse 91, respirations are 18.   He smokes less than 1/2-pack of cigarettes per day, and no labs were  drawn as he is just recently been here.   MENTAL STATUS EXAM:  Showed he is alert and oriented.  He was  appropriately groomed, dressed, and nourished.  His speech was soft and  slow.  He reports depression and anxiety, although he actually looked  the best that I have ever seen him.  His affect was congruent with his  purported mood symptoms.  Thought process was clear, rational, and goal-  oriented.  He felt he needed to be inpatient to remain safe.  Judgment  and insight are good.  Concentration and memory are good.  Intelligence  is average.  He is not actively suicidal, but he also is in a safe  environment, he  does not yet feel that he can contract for safety  outside the environment.  He is not homicidal and he does not have  auditory visual hallucination.   AXIS I:  Major depressive disorder, recurrent, severe without psychotic  features.  AXIS II:  Deferred.  AXIS II:  GERD.  AXIS IV:  Severe burden of illness.  AXIS V:  35.   PLAN:  Admit to provide for safety and stabilization.  His meds will be  adjusted as indicated.  He already has a followup plan in place, and  estimated length of stay is 3-5 days.      Jasmine Pang, M.D.  Electronically Signed     BHS/MEDQ  D:  08/07/2008  T:  08/07/2008  Job:  [id number]

## 2010-12-25 NOTE — Discharge Summary (Signed)
NAMEIVOR, KISHI NO.:  [id number]   MEDICAL RECORD NO.:  [id number]          PATIENT TYPE:  IPS   LOCATION:  0307                          FACILITY:  [facility]   PHYSICIAN:  Jasmine Pang, M.D. DATE OF BIRTH:  [birth year]   DATE OF ADMISSION:  03/12/2008  DATE OF DISCHARGE:  03/24/2008                               DISCHARGE SUMMARY   IDENTIFICATION:  This is a 35 year old single white male who was  admitted on a voluntary basis on March 12, 2008.   HISTORY OF PRESENT ILLNESS:  The patient represented here at the  [facility] reporting that he was suicidal with plan to  shoot himself.  He was just released the other day on March 09, 2008,  that was his 3rd admission since May.  He states he is extremely anxious  and that if he gets a gun, he would shoot himself.  He has no guns at  home, but says he could get one if he wanted to.  The patient states  today that he is still interested in ECT.  Once discharged, he checked  in with his psychiatrist on the outside Dr. Asa Saunas, who stated that ECT  is usually not effective in this type of depression and anxiety.  However, the patient has so many side effects to medications, he was  asking if this could be evaluated.  He states he always has suicidal  ideations and has came back to [place] because he could not take it.  He was  afraid he was going to explode or act on them.  He was last with [place] on  February 26, 2008 to March 09, 2008.   FAMILY HISTORY:  The patient has one uncle who committed suicide.  The  uncle also suffered from anxiety.   ALCOHOL AND DRUG HISTORY:  The patient smokes a half-a-pack to one-pack  cigarettes per day.  He denies alcohol or drug use.   MEDICATIONS:  1. Klonopin 1 mg p.o. t.i.d.  2. Zoloft 100 mg p.o. q.a.m. and h.s.  3. Lopressor 25 mg twice daily.  4. Protonix 40 mg daily.   MEDICAL PROBLEMS:  Tachycardia, which is why the Lopressor as used.   ALLERGIES:  SEROQUEL has  elevated liver enzymes.  RISPERDAL increase his  heart rate.  TRILAFON gives him shakes.  However, he was unable to take  COGENTIN because he had a bad reaction to this.  He has also been known  to have issues with ABILIFY and GEODON causing palpitations.   PHYSICAL FINDINGS:  There were no acute physical or medical problems  noted.   LABORATORY DATA:  He had just been discharged and so repeat labs were  not done.   HOSPITAL COURSE:  Upon admission, the patient was continued on his  Zoloft 100 mg p.o. b.i.d., Protonix 40 mg daily, metoprolol 25 mg twice  daily, and clonazepam 1 mg t.i.d.  He was also started on Anafranil 25  mg p.o. q.h.s. and Gabitril 2 mg twice a day and 4 mg at bedtime.  On  March 13, 2008, the patient climbed  was discontinued.  He was started on  Tranxene 7.5 mg p.o. t.i.d. due to his lack of responsiveness to  Klonopin.  On March 13, 2008, Tranxene was discontinued.  He did not  like the medication and said it made him feel bad.  Instead, he was  resumed on his Klonopin 1 mg p.o. t.i.d. On March 14, 2008, Klonopin was  decreased, so that he could be weaned off the medication since he felt  the Klonopin was not helpful.  It was weaned with a slow titration  downwards.  He was also started on Navane 2 mg in the morning and 2 mg  at h.s.  On March 14, 2008, he was started on Zyprexa Zydis 10 mg p.o.  t.i.d. for anxiety.  He did not like the way this made him feel.  On  March 15, 2008, he stated he felt better when on the Klonopin and wanted  to return to this dose.  He was started on Klonopin 1 mg. p.o. t.i.d.  His Protonix was also increased to 40 mg p.o. b.i.d. and Zyprexa was  discontinued since he did not like the way this made him feel.  In  individual sessions with me, the patient was depressed and anxious.  He  was cooperative.  Initially, he did not attend unit therapeutic groups  and activities, but this improved as hospitalization progressed.  He had   positive suicidal ideation.  He felt hopeless that he was going to get  any better.  He continued to be anxious and depressed and still had  positive suicidal ideation at times half-and-half.  He stated his  mother was very supportive.  He began to experience some symptoms of  esthesia on the Navane.  On March 17, 2008, the Navane was discontinued.  He was placed on Seroquel XR 200 mg p.o. q.h.s.  He was also placed on  Symmetrel 100 mg p.o. b.i.d. to help with the esthesia.  The Anafranil  was discontinued as he was experiencing some tachycardia.  The patient  was continued to be depressed and anxious with suicidal ideation and  hopelessness.  He was worried he would not improved.  On March 19, 2008,  Lopressor was discontinued due to orthostatic hypotension.  He did state  he had less racing thoughts on the Seroquel and has not caused side  effects of so many previous medications had.  On March 20, 2008, he  wanted to be placed back on Lopressor due to tachycardia.  It was  restarted at just 25 mg p.o. q.a.m.  He began to be less depressed and  less anxious with no suicidal ideation.  The amantadine was discontinued  since he was no longer on Navane.  On March 21, 2008, Lopressor was  discontinued again due to hypotension and light-headedness.  On March 23, 2008, he was focused on his heart racing.  However, sleep was  good.  Appetite was good.  An EKG was done, which was within normal  limits.  Heart rate was only 85.  He was evaluated by our nurse  practitioner.  On March 24, 2008, mental status had improved markedly  from admission status.  The patient was less depressed, less anxious.  His affect was wide range.  There was no suicidal or homicidal ideation.  No thoughts of self-injurious behavior.  No auditory or visual  hallucinations.  No paranoia or delusions.  Thoughts were logical and  goal-directed.  Thought content.  No predominant theme.  Cognitive was  grossly intact.   Insight fair.  Judgment good.  He was not displaying  any impulsive tendencies.  It was felt safe for discharge today and his  mother was coming to pick him up.   DISCHARGE DIAGNOSES:  Axis I:  Mood disorder, anxiety disorder, not  otherwise specified.  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  Axis III:  Gastroesophageal reflux disease.  Axis IV:  Severe (burden of psychiatric illness, other psychosocial  stressors).  Axis V: Global assessment of functioning was 50 upon discharge.  GAF was  30 upon admission.  GAF highest past year was 60 to 65.   DISCHARGE PLANS:  There was no specific activity level or dietary  restrictions.   POSTHOSPITAL CARE PLANS:  The patient will go to the [place] on August 20th at 12 p.m.   DISCHARGE MEDICATIONS:  1. Zoloft 100 mg twice daily.  2. Gabitril 2 mg twice a day and 4 mg at bedtime.  3. Klonopin 1 mg three times daily.  4. Seroquel XR 200 mg at bedtime.  5. Protonix as directed by his family doctor.  He was also referred to      his family doctor for continued prescribing of metoprolol as well      as the Protonix.      Jasmine Pang, M.D.  Electronically Signed     BHS/MEDQ  D:  03/24/2008  T:  03/24/2008  Job:  [id number]

## 2010-12-25 NOTE — H&P (Signed)
NAME:  [name], [name]              ACCOUNT NO.:  [id number]   MEDICAL RECORD NO.:  [id number]          PATIENT TYPE:  IPS   LOCATION:  0600                          FACILITY:  BH   PHYSICIAN:  [name], P.A.-C.DATE OF BIRTH:  21-Feb-1976   DATE OF ADMISSION:  02/19/2008  DATE OF DISCHARGE:                       PSYCHIATRIC ADMISSION ASSESSMENT   This is a voluntary admission to the services of [name].   This is a 35 year old single white male.  He presented yesterday as a  walk-in to the behavioral health center here.  He reported he was having  severe anxiety, depression and suicidal ideation.  He has been diagnosed  with anxiety and depression since age 53.  He is currently in outpatient  treatment up in [place].  He also reports severe constant anxiety for  the past week including muscle tension, tremulousness and racing heart.  His depressive symptoms include insomnia, poor appetite, feelings of  hopelessness and helplessness.  Today he told his family members that he  had been having suicidal thoughts with the urge to acquire a gun and  shoot himself.  His family immediately brought him to the behavioral  health center after he disclosed these thoughts.   PAST PSYCHIATRIC HISTORY:  [facility] [place].  He has had numerous admissions in the past  10 years and he, as already stated, is currently in outpatient therapy  with Dr. Elpidio Frye and the therapist [name].  Two years ago he had his  first panic attack and since then he has had a variety of med trials.  The benzos sometimes they help and sometimes they do not, and SNRIs also  increase his heart rate.   SOCIAL HISTORY:  He went to the 11th grade.  He does have a GED.  He has  never married.  He has no children.  He was awarded SSDI last August for  his mental condition.   FAMILY HISTORY:  He does have 1 uncle who comitted suicide.   ALCOHOL AND  DRUG HISTORY:  He smokes one half to 1 pack a day.   He currently has no primary care [name].  His psychiatrist is Dr.  Elpidio Frye in [place], [place] and his therapist is [name].   MEDICAL PROBLEMS:  He had GERD.   MEDICATIONS:  He is currently prescribed Zoloft 100 mg p.o. b.i.d.,  Klonopin 1 mg t.i.d., Lopressor 25 mg b.i.d. but this is to control his  heart rate not that he has hypertension, and Protonix 40 mg p.o. daily.   DRUG ALLERGIES:  He had no known drug allergies.   POSITIVE PHYSICAL FINDINGS:  He is a 35 year old white male who appears  his stated age.  He is not muscularly fit but he is not grossly obese.  He is 5 feet 9 inches, weighs 206.  Temperature is 98, blood pressure is  122/86 to 127/90, pulse is 81-89, respirations are 20.  He does have  noticeably damp palms from his anxiety and he runs tachycardic at times.   I do not have  his lab work back yet but it was ordered including a TSH.   MENTAL STATUS EXAM:  Today he is alert and oriented.  He is casually  groomed and dressed in jeans and a T-shirt.  He appears to be adequately  nourished.  His speech is normal rate, rhythm and tone.  His mood is  anxiously depressed.  His affect is congruent.  Thought processes are  clear, rational and goal-oriented.  He would like to find something that  is helpful without bothersome side effects.  Judgment and insight are  intact.  Concentration and memory are intact.  Intelligence is at least  average.  He is still having suicidal ideation, however, he no longer  has the urge to go buy the gun.   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  AXIS II:  Deferred.  AXIS III:  Gastroesophageal reflux disease with occasional tachycardia.  AXIS IV:  Severe.  AXIS V:  35.   PLAN:  The plan is to admit for safety and stabilization.  After  reviewing his med history, it was to decided to add doxepin in a  low  dose 10 mg t.i.d. and 50 mg at h.s. to help with his anxiety and sleep.  Estimated length of stay is 3-5 days.      [name], P.A.-C.     MD/MEDQ  D:  02/20/2008  T:  02/20/2008  Job:  [id number]

## 2010-12-25 NOTE — Discharge Summary (Signed)
NAME:  [name], [name] NO.:  [id number]   MEDICAL RECORD NO.:  [id number]          PATIENT TYPE:  IPS   LOCATION:  0302                          FACILITY:  BH   PHYSICIAN:  Jasmine Pang, M.D. DATE OF BIRTH:  1976-07-17   DATE OF ADMISSION:  09/23/2008  DATE OF DISCHARGE:  09/29/2008                               DISCHARGE SUMMARY   IDENTIFICATION:  This is a 35 year old single white male, who was  admitted on a voluntary basis to September 23, 2008.   HISTORY OF PRESENT ILLNESS:  The patient presents as a walk-in  requesting re-admission after insisting on leaving September 22, 2008.  He had been admitted to September 20, 2008, to September 22, 2008, after  checking himself out of [facility] because he did not like being  bothered with interns and residents.  He reports that he is suicidal  with a plan to shoot himself.  There is no history of suicide attempts.   PAST PSYCHIATRIC HISTORY:  The patient has had multiple admissions to  our hospital.  He has also had prior admits to the [facility] in  [place] in the past.  He has also been admitted to [facility] as indicated  above and was admitted to [facility] for electroshock  therapy.  He was seeing Dr. Tomasa Rand on an outpatient basis at  [facility], but Dr. Tomasa Rand felt he needed a more  intensive team, such as an active team to provide the wraparound  services that he felt this patient needed.   MEDICAL PROBLEMS:  GERD.   MEDICATIONS:  Nexium 40 mg daily.   DRUG ALLERGIES:  LAMICTAL causes itching.   PHYSICAL FINDINGS:  There were no acute physical or medical problems  noted.  The patient had just been in the hospital prior to this  admission and we did not repeat laboratories.   HOSPITAL COURSE:  Upon admission, the patient was started on Klonopin 1  mg p.o. q.i.d., Nexium 40 mg p.o. daily, and Coreg 6.25 mg p.o. b.i.d.  He was also prescribed Seroquel 25 mg p.o.  q.h.s. p.r.n.  He may repeat  x1.  His Klonopin and was to be tapered off and was reduced to 1 mg p.o.  t.i.d.  In individual sessions with me, the patient did not appear  depressed.  He wants to be here, but cannot describe any crisis or  circumstance to warrant inpatient.  He has no active suicidal ideation  or intent or plan.  It was felt to arise secondary gain from repeated  inpatient stays.  As hospitalization progressed, the patient discussed  his history of mood swings.  There was still positive suicidal ideation  according to him.  The patient was started on Depakote ER 500 mg p.o.  q.h.s.  His Seroquel was increased to Seroquel XR 50 mg p.o. q.h.s. and  he was started on a multivitamin 1 daily.  On September 27, 2008, the  patient was anxious and irritable.  Sleep was poor.  Appetite was up and  down.  His Seroquel XR was increased to 100 mg q.h.s.  On September 28, 2008, his mood continued to feel up and down.  He had been talking  with his mother on the phone who is a good support for him.  He  discussed his father's death recently, which was quite traumatic.  On  September 28, 2008, clonazepam was decreased to 0.5 mg p.o. q.i.d.  On  September 29, 2008, his sleep was good, appetite was good.  Mood was less  depressed and less anxious.  Affect was consistent with mood.  There was  no suicidal or homicidal ideation.  No thoughts of self-injurious  behavior.  No auditory or visual hallucinations.  No paranoia or  delusions.  Thoughts were logical and goal-directed.  Thought content,  no predominant theme.  Cognitive was grossly intact.  Insight good.  Judgment good.  Impulse control good.  The patient wanted to go home  today and he was felt to be safe for discharge.   DISCHARGE DIAGNOSES:  Axis I:  Mood disorder, not otherwise specified.  Axis II: Borderline personality disorder.  Axis III:  Gastroesophageal reflux disease.  Axis IV:  Moderate (burden of psychiatric illness and  repeated  psychiatric hospitalizations).  Axis V:  Global assessment of functioning was 50 upon discharge.  GAF  was 40 upon admission.  GAF highest past year was 60.   DISCHARGE PLANS:  There was no specific activity level or dietary  restriction.   POSTHOSPITAL CARE PLANS:  The patient refused to follow up.  He stated  he wanted to make this on his own he would not allow [place] to schedule an  appointment for him with his therapist or psychiatrist.   DISCHARGE MEDICATIONS:  1. Seroquel XR 100 mg at bedtime.  2. Clonazepam 0.5 mg p.o. q.i.d.  3. Coreg 6.25 mg twice a day.  4. Depakote 500 mg at bedtime.  5. Nexium 40 mg daily.      Jasmine Pang, M.D.  Electronically Signed     BHS/MEDQ  D:  10/20/2008  T:  10/21/2008  Job:  [id number]

## 2010-12-25 NOTE — Discharge Summary (Signed)
NAME:  [name], [name]              ACCOUNT NO.:  [id number]   MEDICAL RECORD NO.:  [id number]          PATIENT TYPE:  IPS   LOCATION:  0500                          FACILITY:  BH   PHYSICIAN:  Geoffery Lyons, M.D.      DATE OF BIRTH:  11/11/1975   DATE OF ADMISSION:  09/09/2008  DATE OF DISCHARGE:  09/15/2008                               DISCHARGE SUMMARY   CHIEF COMPLAINT AND PRESENT ILLNESS:  This was one of multiple  admissions to [facility] for this 35 year old male  voluntarily admitted.  History of depression.  Discharged from  [facility] September 08, 2008, went home, called his primary  psychiatrist who told him that there was no reason to see him.  Would  not see the patient unless he was treated for bipolar.  This created a  lot of confusion and anxiety in the patient.  He did not feel he could  be safe at home.   PAST MEDICAL HISTORY:  Long history of depression and anxiety, multiple  medication trials including a course with ECT.  He was recently  discharged from [facility] September 08, 2008.  Followed up on an  outpatient basis by Dr. Tomasa Rand.   ALCOHOL AND DRUG HISTORY:  Denies active use of any substances.   MEDICAL HISTORY:  1. Gastroesophageal reflux.  2. Tachycardia.   MEDICATIONS:  1. Coreg 6.25 mg twice a day.  2. Nexium 40 mg per day.  3. Klonopin 1 mg in the morning, 1 at bedtime, 1.5 at noon and at 5      p.m.   PHYSICAL EXAMINATION:  Failed to show any acute findings.   LABORATORY WORK:  Results not in the chart.   MENTAL STATUS EXAM:  Reveals alert, cooperative male.  Mood anxious,  depressed, affect anxious.  Thought processes logical, coherent and  relevant.  His speech was normal in rate, tempo and production.  Feeling  quite overwhelmed with everything that is going on, and the fact that he  has not been able to find medication to help his symptomatology, feeling  more and more overwhelmed, suicidal ruminations,  no active plans, no  intent.  No hallucinations.  Cognition well-preserved.   DIAGNOSES:  Axis I:  A.  Major depressive disorder.  B.  Anxiety disorder, not otherwise specified.  Axis II:  No diagnosis.  Axis III:  A.  Hypertension.  B.  Gastroesophageal reflux.  C.  Tachycardia.  Axis IV: Moderate.  Axis V:  On admission 35, GAF in the last year 60.   COURSE IN THE HOSPITAL:  He was admitted, started in individual and  group psychotherapy.  Still dealing with the death of his father, felt  overwhelmed with the way he was feeling.  Suicidal ideations as already  stated.  Told by outpatient psychiatrist that he was bipolar and that he  needed to be treated for bipolar.  He did endorse fluctuation within the  depression, negative response to antidepressants, family history of  bipolar on father's side.  But, he has been on Depakote, Lamictal,  Neurontin, lithium, Seroquel, Zyprexa,  Geodon, Risperdal, Abilify.  He  had never been on Tegretol, so he was willing to give Tegretol a try.  September 13, 2008 some mild sedation to the Tegretol, otherwise no overt  side effects.  Willing to continue taking it.  September 14, 2008 Tegretol  was making him feel strange, having changes in his blood pressure but  not in his heart rate, willing to give it a try.  September 15, 2008 he  was endorsing increased heart rate, irritability, felt it was secondary  to the Tegretol, blood level 7.6.  Wanted to go off the Tegretol as he  felt the side effects were not worth going through them with no benefit.  He __________ like to consider ECT.  We went ahead and referred for ECT,  [facility], September 15, 2008.  He was accepted for ECT.  No active  suicidal or homicidal ideations, wanting this modality of treatment and  give it another try as medication does not seem to help him.   DISCHARGE DIAGNOSES:  Axis I:  A.  Mood disorder, not otherwise specified.  B.  Depressive disorder, not otherwise  specified.  C.  Anxiety, not otherwise specified.  Axis II:  No diagnosis.  Axis III:  A.  Tachycardia.  B.  Hypertension.  C.  Gastroesophageal reflux.  Axis IV: Moderate.  Axis V:  Upon discharge 50.   DISCHARGED ON:  1. Coreg 6.25 mg twice a day.  2. Nexium 40 mg per day.  3. Klonopin 1 mg 8:00 in the morning and at bedtime, 1.5 at 12:00 and      5:00.   [facility]      Geoffery Lyons, M.D.  Electronically Signed     IL/MEDQ  D:  10/12/2008  T:  10/13/2008  Job:  [id number]

## 2010-12-25 NOTE — H&P (Signed)
[name], [name]              ACCOUNT NO.:  [id number]   MEDICAL RECORD NO.:  [id number]          PATIENT TYPE:  IPS   LOCATION:  0506                          FACILITY:  BH   PHYSICIAN:  Geoffery Lyons, M.D.      DATE OF BIRTH:  06-04-1976   DATE OF ADMISSION:  09/09/2008  DATE OF DISCHARGE:                       PSYCHIATRIC ADMISSION ASSESSMENT   TIME:  8:40 a.m.   IDENTIFYING INFORMATION:  A 35 year old male.  This is a voluntary  admission.   HISTORY OF PRESENT ILLNESS:  This is one of several [facility] admissions for  this 35 year old with a history of depression who was discharged from  [facility] last on September 08, 2008.  Went home, called his  primary psychiatrist who told him that there was no reason to see him.  Would not allow the patient to schedule a follow up appointment.  Says  that he got confused.  Does not understand why he cannot schedule  followup with his regular psychiatrist.  This caused him to get anxious,  got a little agitated, felt unsafe.  Came back to the hospital, just not  sure if he could be safe at home.  Did not attempt to harm himself in  any way.  No homicidal thoughts.  No hallucinations.   PAST PSYCHIATRIC HISTORY:  A long history of depression with multiple  medication trials including one course of ECT.  Most recently discharged  from [facility] on September 08, 2008.  Followed as an outpatient  by Dr. Huel Coventry in [place], [place].   SOCIAL HISTORY:  Single Caucasian male who lives in [place], [place]  with his family.  Most recently dealing with stressors of his father's  history of cancer and death on August 31, 2008.  He was buried last  week on September 01, 2008.  He has been having significant difficulty  dealing with this.  He is on disability for his depression.   PRIMARY CARE PHYSICIAN:  Dr. Wray Kearns in [place], [place].   MEDICAL PROBLEMS:  GERD.   CURRENT MEDICATIONS:  1. Coreg 6.25 mg p.o.  b.i.d.  2. Nexium 40 mg daily.  3. Klonopin 1 mg in the morning and 1 at bedtime; 1.5 mg at 12 noon      and 5 p.m.   DRUG ALLERGIES:  NONE.   PHYSICAL EXAMINATION:  Physical exam is done and noted in the record.  This is a healthy-appearing white male with normal vital signs.  He is  cooperative, pleasant.   DIAGNOSTICS:  Diagnostic studies are currently pending.   MENTAL STATUS EXAM:  Fully alert male, pleasant, cooperative, oriented  x4, quite perplexed and feels very insecure that he does not have any  physician followup at this point.  We discussed with him that we are not  exactly sure why this happened either, we can understand and sympathize  with his stress.  His speech is normal.  Mood depressed.  Denying active  suicidal thoughts today, but does have some passive suicidal thoughts,  getting a little bit anxious.  No panic.  He has been appropriate today.  Thinking is  linear.  No homicidal thoughts.  No evidence of psychosis.  Cognition is fully intact.   AXIS I:  Major depressive episode.  AXIS II:  Deferred.  AXIS III:  Hypertension.  Gastroesophageal reflux disease.  AXIS IV:  Severe issues with chronic depression.  AXIS V:  Current 48, past year not known.   PLAN:  The plan is to admit him.  We are going to get back in touch with  his primary psychiatrist.  We have also discussed a possible referral on  to another psychiatrist which the patient has requested.  We are going  to continue his current medications.      Margaret A. Scott, N.P.      Geoffery Lyons, M.D.  Electronically Signed    MAS/MEDQ  D:  09/10/2008  T:  09/10/2008  Job:  [id number]

## 2010-12-25 NOTE — H&P (Signed)
[name], [name] NO.:  [id number]   MEDICAL RECORD NO.:  [id number]          PATIENT TYPE:  IPS   LOCATION:  0307                          FACILITY:  BH   PHYSICIAN:  Jasmine Pang, M.D. DATE OF BIRTH:  August 13, 1975   DATE OF ADMISSION:  10/01/2008  DATE OF DISCHARGE:                       PSYCHIATRIC ADMISSION ASSESSMENT   This is a voluntary admission to the services of Dr. Milford Cage.  This is a 35 year old single white male.  He presented yet again at the  admissions office reporting that he was suicidal, he was having mood  swings, and he was irritable.  He reports since his discharge from the  [facility] 2 days ago his mood is unstable. He is  depressed and irritable.  No one wants to be around him.  His primary  care physician told him yesterday there was nothing he could do to help  him.  He reports a psychiatrist, Dr. Tomasa Rand, terminated him as a  patient and he feels like giving up and is suicidal and cannot contract  for safety outside the hospital. He reports he has not slept since he  was discharged.   PAST PSYCHIATRIC HISTORY:  He was just with [place] February 12-18, 2010.   Social history, family history, and medical history are unchanged and as  before.   His medications are Nexium 40 mg p.o. daily in the morning, Coreg 6.25  p.o. b.i.d., Depakote 500 mg at h.s., Seroquel 100 mg h.s., Klonopin 0.5  mg p.o. q.i.d. and a multivitamin.   ALLERGIES:  He reports that he is allergic to LAMICTAL.   POSITIVE PHYSICAL FINDINGS:  GENERAL APPEARANCE:  Well-developed, well-  nourished white male who appears his stated age of 22.  He is a little  sleepy today, consistent with the report that he had finally gotten some  sleep.  VITAL SIGNS:  On admission he is 70 inches tall, weighs 225, temperature  is 97.3, blood pressure is 117/82, pulse 62, respirations 18.   MENTAL STATUS EXAM:  He is alert and oriented.  He was  appropriately  groomed, dressed and nourished.  He walks unaided.  He has good eye  contact. His speech is not pressured.  His mood he reports that he is  anxious and irritable.  However his mood is calm. His affect is if  anything flat and blunted.  Thought process is somewhat clear, rational  and goal oriented.  He wants to be treated for his anxiety.  Judgment  and insight are fair.  Concentration and memory are intact.  Intelligence is at least average.   DIAGNOSES:  AXIS I:  Bipolar disorder, NOS.  AXIS II:  Deferred.  AXIS III:  No active medical illnesses.  AXIS IV:  Severe, burden of illness.  AXIS V:  35.   The plan is to admit for safety and stabilization. His medication  regimen will once again be reviewed and adjusted as indicated, and maybe  he will consider getting a cytochrome P450 genetic test done at the [facility] since he has such drug sensitivities. Estimated length of stay is  3-5 days.      Mickie Leonarda Salon, P.A.-C.      Jasmine Pang, M.D.  Electronically Signed    MD/MEDQ  D:  10/02/2008  T:  10/02/2008  Job:  [id number]

## 2010-12-28 NOTE — Discharge Summary (Signed)
NAME:  [name], [name] NO.:  [id number]   MEDICAL RECORD NO.:  [id number]          PATIENT TYPE:  IPS   LOCATION:  0505                          FACILITY:  [facility]   PHYSICIAN:  [name], M.D.      DATE OF BIRTH:  Nov 07, 1975   DATE OF ADMISSION:  10/14/2008  DATE OF DISCHARGE:  10/21/2008                               DISCHARGE SUMMARY   CHIEF COMPLAINT AND HISTORY OF PRESENT ILLNESS:  This is one of multiple  admissions to [facility] for this 35 year old  male for 10 admissions since February 19, 2008.  He again presented  voluntarily claims he is suicidal, claims he has a plan to shoot himself  with a gun.  Verbalizing feeling like he is in chronic emotional pain.  He stays in eternally a flight or fight response, constant anxiety of  panic attacks.  He was not able to contract for safety and hence was  readmitted.  History of multiple admissions to [facility].  Recently being followed by Dr. Tomasa Frye but apparently  he is refusing to follow him up this time if he was not diagnosed with  bipolar and treated accordingly.   ALCOHOL AND DRUG HISTORY:  He denies active use of any substances.   MEDICAL HISTORY:  Tachycardia.   MEDICATIONS:  1. Coreg 6.25 mg twice a day.  2. Seroquel 50 mg at bedtime.  3. Nexium 40 mg before breakfast.  4. Depakote ER 500 two at bedtime.  5. Multivitamin.  6. Klonopin 1 mg four times a day.   Exam failed to show any acute findings.   LABORATORY WORKUP:  Depakote level 57.9.   MENTAL STATUS EXAM:  Reveals an alert, cooperative male appropriately  groomed, dressed and nourished.  Good eye contact.  Speech is normal  rate, tempo and production.  Claimed he was anxious and irritable.  Affect remains somewhat flat.  Thought processes are clear, rational and  goal oriented.  No active delusions.  No active homicidal ideas,  suicidal ruminations although he can contract.  Claimed he  needs to be  treated for anxiety.  Cognition well-preserved.   AXIS I:  Generalized anxiety disorder, panic attacks.  Mood disorder not  otherwise specified.  AXIS II:  No diagnosis.  AXIS III:  No diagnosis.  AXIS IV:  Moderate.  AXIS V:  Upon admission 35-40, highest GAF in the last year 70.   HOSPITAL COURSE:  He was admitted.  He was started in individual and  group psychotherapy.  He was maintained on his medications.  He claims  persistent anxiety 24/7.  Said he would like to be considered for an MRI  as he has checked and done more research that the level of anxiety he  experiences and the persistence he is afraid that something else is  going on.  Increasingly more frustrated, hopeless, helpless, nothing  seems to help, does not see himself as having any difficulty with mood  other than being frustrated with the persistence of the anxiety.  Upon  his initial admission the treatment  plan included that if he was to be  admitted he was going to be detoxed from the Klonopin.  We went ahead  and activated the plan as he seems to have agreed to go through the  detox of Klonopin which he was told if he was to be readmitted.  March 9  upset because we were removing the only medication that has helped me  in the past.  He wanted to do an MRI as still feels that there was  something wrong.  We tried the scan but he was unable to go through it,  endorsing increased anxiety, feeling panicky, feeling mushed by the  machine.  March 10 upset because he could not get through with the MRI  due to the increased anxiety.  Claimed that he is claustrophobic,  feeling upset as he was not able to go through it.  Endorsed worsening  of the anxiety since we decreased the Klonopin.  We reassessed the  treatment plan.  We went back to 1 mg of Klonopin four times a day.  We  tried the Klonopin again.  He has an appointment with Dr. Valente Frye,  his new psychiatrist, that is going to happen in the next  several days.  We are going to allow this to happen before any changes of Klonopin are  adjusted.  Dr. Ilsa Frye was willing to keep him on the Klonopin.  The  feeling was that medically wise we should not be detoxing him at this  particular time as going to withdrawing him from Klonopin was going to  make matters worse and we did not have much to offer to replace it as he  has been on multiple psychotropic medications.  March 11 he was able to  go through the MRI as he was back on the full dose of Klonopin.  MRI was  normal.  We discussed options.  We considered for discharge in the  morning.  March 12 in full contact with reality.  The Depakote was  affecting his GI tract and he was wanting to decrease it some.  Level  was therapeutic.  He was denying any suicidal or homicidal ideas.  He  was going to see Dr. Ilsa Frye on Tuesday.  Given the fact that he is going  to see Dr. Valente Frye and Dr. Ilsa Frye is part of [facility]  and this group works with [name] in [place] with Dr. Betti Frye  main partner of [facility] admits his patients and given the fact  that we have exhausted anything that we can offer to him we suggested  that if in need of another inpatient stay he would rather be served by  going to [facility] where he can be treated by the physicians that are  going to continue outpatient treatment for him.   DISCHARGE DIAGNOSES:  AXIS I:  Generalized anxiety disorder.  Mood  disorder not otherwise specified.  AXIS II:  No diagnosis.  AXIS III:  No diagnosis.  AXIS IV:  Moderate.  AXIS V:  On discharge 50-55.   DISCHARGE MEDICATIONS:  1. Discharged on Klonopin 1 mg four times a day.  2. Coreg 6.25 mg twice a day.  3. Depakote ER 500 mg two at bedtime.  4. Klonopin 1 mg four times a day.  5. Seroquel 25 one to two at night.   FOLLOWUP:  Follow up with Dr. Valente Frye, [facility].      [name], M.D.  Electronically Signed  IL/MEDQ   D:  11/16/2008  T:  11/16/2008  Job:  [id number]

## 2010-12-28 NOTE — Discharge Summary (Signed)
[name], [name]              ACCOUNT NO.:  [id number]   MEDICAL RECORD NO.:  [id number]          PATIENT TYPE:  IPS   LOCATION:  0302                          [facility]ACILITY:  [facility]   PHYSICIAN:  Jasmine Pang, M.D. DATE OF BIRTH:  18-Sep-1975   DATE OF ADMISSION:  08/06/2008  DATE OF DISCHARGE:  08/08/2008                               DISCHARGE SUMMARY   IDENTIFYING INFORMATION:  This is a 35 year old Caucasian male, single.  This is a voluntary admission.   HISTORY OF PRESENT ILLNESS:  This is one of several [facility] admissions for  this 35 year old who lives in [place].  He presented with suicidal  thoughts and a plan to acquire a gun and shoot himself.  He also  complained of increasing anxiety, getting a little agitated, feeling he  could not be safe.  He reported that the aggravating factors included  breaking up with his girlfriend just before Christmas.  His father also  has cancer and he was feeling a lot of pressure at home.  He endorsed a  depressed mood, some all lack of sleep, anxiety, feeling a lot of muscle  tightness, some tightness in his chest without any chest pain, having  suicidal thoughts for about 2 days prior to admission.  Physical exam  was performed with no acute findings.   ADMITTING MEDICATIONS:  1. Aciphex 20 mg daily.  2. Clonazepam 1 mg four times a day.  3. Carvedilol 6.25 mg p.o. b.i.d.   He is currently followed by a psychiatrist here in [place], Dr. Tiajuana Amass for suicidal thoughts and  hallucinations.   SIGNIFICANT FINDINGS WHILE ADMITTED:  He is single, never married.  No  children.  He is living with his parents in [place].  Only medical  problems are acid reflux and hypertension.  He is unemployed.   ADMITTING MENTAL STATUS EXAM:  Revealed an alert, fully oriented  gentleman appropriately groomed, dressed and nourished.  Speech is soft  and slow, endorsing quite a bit of depression with anxiety.  Affect  congruent with reported  mood symptoms.  Thought process clear, rational  and goal oriented.  He felt he needed inpatient stay to remain safe.  Intelligence average.  Insight, impulse control and judgment all within  normal limits and cognition completely preserved.   COURSE OF HOSPITALIZATION:  He was admitted to our unit.  We continued  his current medications without changing them.  He was initially seen  for assessment on August 07, 2008, and started on our [facility] Team for  stabilization and alleviation of his depression symptoms and his  suicidal thoughts.  We did consider changing his medications, but he has  a history of being intolerant to many mood stabilizers and wanted to try  staying with the same medications.  By August 08, 2008, he reported  that he was feeling better and had a chance to talk in group, felt more  stable, did not feel a family session was necessary.  He had denied any  suicidal thoughts and slept well, felt safe to be discharged.  He wanted  to pursue outpatient appointment  which was scheduled with [facility], Dr. Tiajuana Amass on August 18, 2008, at 10:45,  and for a second appointment on September 14, 2008, at 1:30 p.m.  He is  also seeing a counselor, Sol Blazing, and he will schedule his own  follow-up appointment.  Medications were not changed.   DISCHARGE DIAGNOSES:  Axis I:  Major depressive disorder, recurrent,  severe.  Anxiety disorder, not otherwise specified.  Axis II:  Deferred.  Axis III:  Gastroesophageal reflux disease, hypertension.  Axis IV:  Moderate to severe burden of illness.  Axis V:  Global Assessment of Functioning current 59, past year 60.   PLAN:  Discharge him today.  He will return home, has adequate  medications and prescriptions were not written.      Margaret A. Lorin Picket, N.P.      Jasmine Pang, M.D.  Electronically Signed    MAS/MEDQ  D:  08/15/2008  T:  08/15/2008  Job:  [id number]

## 2011-01-23 ENCOUNTER — Telehealth: Payer: Self-pay | Admitting: Internal Medicine

## 2011-01-23 NOTE — Telephone Encounter (Signed)
Pt has appt 7-20 dr Graciela Husbands, calling to see if he can be seen sooner due to sob and chest discomfort

## 2011-03-01 ENCOUNTER — Ambulatory Visit: Payer: Medicare Other | Admitting: Internal Medicine

## 2011-05-09 LAB — HEPATIC FUNCTION PANEL
ALT: 37
AST: 21
Alkaline Phosphatase: 77
Bilirubin, Direct: 0.1
Total Bilirubin: 0.5

## 2011-05-09 LAB — COMPREHENSIVE METABOLIC PANEL
ALT: 36
AST: 19
Calcium: 9.6
Creatinine, Ser: 1.02
GFR calc Af Amer: >60
Sodium: 140
Total Protein: 7.1

## 2011-05-09 LAB — CBC
MCHC: 33.8
MCV: 85
RDW: 13.7

## 2011-05-09 LAB — TSH: TSH: 1.707

## 2011-05-10 LAB — DRUGS OF ABUSE SCREEN W/O ALC, ROUTINE URINE
Creatinine,U: 256.9
Marijuana Metabolite: NEGATIVE
Methadone: NEGATIVE
Opiate Screen, Urine: NEGATIVE
Phencyclidine (PCP): NEGATIVE
Propoxyphene: NEGATIVE

## 2011-05-10 LAB — BENZODIAZEPINE, QUANTITATIVE, URINE
Flurazepam GC/MS Conf: NEGATIVE
Nordiazepam GC/MS Conf: NEGATIVE
Oxazepam GC/MS Conf: NEGATIVE

## 2011-05-14 LAB — DRUGS OF ABUSE SCREEN W/O ALC, ROUTINE URINE
Amphetamine Screen, Ur: NEGATIVE
Cocaine Metabolites: NEGATIVE
Marijuana Metabolite: NEGATIVE
Methadone: NEGATIVE
Phencyclidine (PCP): NEGATIVE

## 2011-05-14 LAB — BENZODIAZEPINE, QUANTITATIVE, URINE
Alprazolam (GC/LC/MS), ur confirm: NEGATIVE
Oxazepam GC/MS Conf: NEGATIVE

## 2011-05-15 LAB — COMPREHENSIVE METABOLIC PANEL
BUN: 18
CO2: 29
Calcium: 9.2
Chloride: 106
Creatinine, Ser: 1.08
GFR calc Af Amer: >60
GFR calc non Af Amer: >60
Glucose, Bld: 95
Total Bilirubin: 0.5

## 2011-05-15 LAB — RPR: RPR Ser Ql: NONREACTIVE

## 2011-05-15 LAB — VALPROIC ACID LEVEL: Valproic Acid Lvl: 22.7 — ABNORMAL LOW

## 2011-05-15 LAB — TSH: TSH: 1.147

## 2011-05-17 LAB — COMPREHENSIVE METABOLIC PANEL
AST: 18 U/L (ref 0–37)
Albumin: 3.9 g/dL (ref 3.5–5.2)
Alkaline Phosphatase: 62 U/L (ref 39–117)
BUN: 11 mg/dL (ref 6–23)
Chloride: 108 mEq/L (ref 96–112)
GFR calc Af Amer: >60 mL/min (ref >60)
Potassium: 3.9 mEq/L (ref 3.5–5.1)
Total Protein: 7 g/dL (ref 6.0–8.3)

## 2011-05-17 LAB — CBC
HCT: 45.5 % (ref 39.0–52.0)
Platelets: 241 10*3/uL (ref 150–400)
RDW: 12.8 % (ref 11.5–15.5)
WBC: 11.1 10*3/uL — ABNORMAL HIGH (ref 4.0–10.5)

## 2020-09-12 ENCOUNTER — Ambulatory Visit (INDEPENDENT_AMBULATORY_CARE_PROVIDER_SITE_OTHER)
Admission: EM | Admit: 2020-09-12 | Discharge: 2020-09-13 | Disposition: A | Discharge status: Home / Self Care | Payer: Medicare Other | Source: Home / Self Care | Attending: Psychiatry | Admitting: Psychiatry

## 2020-09-12 ENCOUNTER — Emergency Department (HOSPITAL_COMMUNITY)
Admission: EM | Admit: 2020-09-12 | Discharge: 2020-09-12 | Disposition: A | Discharge status: Home / Self Care | Payer: Medicare Other | Attending: Emergency Medicine | Admitting: Emergency Medicine

## 2020-09-12 ENCOUNTER — Encounter (HOSPITAL_COMMUNITY): Payer: Self-pay | Admitting: Registered Nurse

## 2020-09-12 ENCOUNTER — Encounter (HOSPITAL_COMMUNITY): Payer: Self-pay

## 2020-09-12 ENCOUNTER — Other Ambulatory Visit: Payer: Self-pay

## 2020-09-12 DIAGNOSIS — I10 Essential (primary) hypertension: Secondary | ICD-10-CM | POA: Diagnosis not present

## 2020-09-12 DIAGNOSIS — Z9151 Personal history of suicidal behavior: Secondary | ICD-10-CM | POA: Diagnosis not present

## 2020-09-12 DIAGNOSIS — R1013 Epigastric pain: Secondary | ICD-10-CM | POA: Diagnosis not present

## 2020-09-12 DIAGNOSIS — R45851 Suicidal ideations: Secondary | ICD-10-CM | POA: Insufficient documentation

## 2020-09-12 DIAGNOSIS — Z20822 Contact with and (suspected) exposure to covid-19: Secondary | ICD-10-CM | POA: Insufficient documentation

## 2020-09-12 DIAGNOSIS — F332 Major depressive disorder, recurrent severe without psychotic features: Secondary | ICD-10-CM | POA: Insufficient documentation

## 2020-09-12 DIAGNOSIS — R110 Nausea: Secondary | ICD-10-CM | POA: Diagnosis not present

## 2020-09-12 DIAGNOSIS — F17210 Nicotine dependence, cigarettes, uncomplicated: Secondary | ICD-10-CM | POA: Diagnosis not present

## 2020-09-12 DIAGNOSIS — F411 Generalized anxiety disorder: Secondary | ICD-10-CM | POA: Diagnosis not present

## 2020-09-12 LAB — POCT URINE DRUG SCREEN - MANUAL ENTRY (I-SCREEN)
POC Amphetamine UR: POSITIVE — AB
POC Buprenorphine (BUP): NOT DETECTED
POC Cocaine UR: NOT DETECTED
POC Marijuana UR: NOT DETECTED
POC Methadone UR: NOT DETECTED
POC Methamphetamine UR: NOT DETECTED
POC Morphine: NOT DETECTED
POC Oxazepam (BZO): POSITIVE — AB
POC Oxycodone UR: NOT DETECTED
POC Secobarbital (BAR): NOT DETECTED

## 2020-09-12 LAB — URINALYSIS, MICROSCOPIC (REFLEX)
RBC / HPF: NONE SEEN RBC/hpf (ref 0–5)
WBC, UA: NONE SEEN WBC/hpf (ref 0–5)

## 2020-09-12 LAB — URINALYSIS, ROUTINE W REFLEX MICROSCOPIC
Glucose, UA: 100 mg/dL — AB
Hgb urine dipstick: NEGATIVE
Ketones, ur: >80 mg/dL — AB
Leukocytes,Ua: NEGATIVE
Nitrite: NEGATIVE
Protein, ur: 30 mg/dL — AB
Specific Gravity, Urine: >1.03 — ABNORMAL HIGH (ref 1.005–1.030)
pH: 6 (ref 5.0–8.0)

## 2020-09-12 LAB — RESP PANEL BY RT-PCR (FLU A&B, COVID) ARPGX2
Influenza A by PCR: NEGATIVE
Influenza B by PCR: NEGATIVE
SARS Coronavirus 2 by RT PCR: NEGATIVE

## 2020-09-12 LAB — POC SARS CORONAVIRUS 2 AG: SARS Coronavirus 2 Ag: NEGATIVE

## 2020-09-12 MED ORDER — LORAZEPAM 1 MG PO TABS
1.5000 mg | ORAL_TABLET | Freq: Four times a day (QID) | ORAL | Status: DC
  Administered 2020-09-12 – 2020-09-13 (×2): 1.5 mg via ORAL
  Filled 2020-09-12 (×2): qty 1

## 2020-09-12 MED ORDER — METOCLOPRAMIDE HCL 10 MG PO TABS: 10.0000 mg | ORAL_TABLET | Freq: Four times a day (QID) | ORAL | Status: DC | PRN

## 2020-09-12 MED ORDER — TIZANIDINE HCL 2 MG PO TABS: 2.0000 mg | ORAL_TABLET | Freq: Three times a day (TID) | ORAL | Status: DC

## 2020-09-12 MED ORDER — OLANZAPINE 5 MG PO TABS
5.0000 mg | ORAL_TABLET | Freq: Every day | ORAL | Status: DC
  Administered 2020-09-12: 5 mg via ORAL
  Filled 2020-09-12: qty 1

## 2020-09-12 MED ORDER — ONDANSETRON HCL 4 MG PO TABS
4.0000 mg | ORAL_TABLET | Freq: Four times a day (QID) | ORAL | Status: DC | PRN
  Administered 2020-09-13: 4 mg via ORAL
  Filled 2020-09-12: qty 1

## 2020-09-12 MED ORDER — DICYCLOMINE HCL 20 MG PO TABS: 20.0000 mg | ORAL_TABLET | Freq: Three times a day (TID) | ORAL | Status: DC | PRN

## 2020-09-12 MED ORDER — PANTOPRAZOLE SODIUM 40 MG PO TBEC
40.0000 mg | DELAYED_RELEASE_TABLET | Freq: Every morning | ORAL | Status: DC
  Administered 2020-09-13: 40 mg via ORAL
  Filled 2020-09-12: qty 1

## 2020-09-12 MED ORDER — GABAPENTIN 400 MG PO CAPS
400.0000 mg | ORAL_CAPSULE | Freq: Three times a day (TID) | ORAL | Status: DC
  Administered 2020-09-12 – 2020-09-13 (×2): 400 mg via ORAL
  Filled 2020-09-12 (×2): qty 1

## 2020-09-12 MED ORDER — ONDANSETRON HCL 4 MG PO TABS
4.0000 mg | ORAL_TABLET | Freq: Once | ORAL | Status: AC
  Administered 2020-09-12: 4 mg via ORAL
  Filled 2020-09-12: qty 1

## 2020-09-12 MED ORDER — METOPROLOL TARTRATE 25 MG PO TABS
25.0000 mg | ORAL_TABLET | Freq: Two times a day (BID) | ORAL | Status: DC
  Administered 2020-09-12 – 2020-09-13 (×2): 25 mg via ORAL
  Filled 2020-09-12 (×2): qty 1

## 2020-09-12 MED ORDER — LIDOCAINE VISCOUS HCL 2 % MT SOLN: 15.0000 mL | Freq: Four times a day (QID) | OROMUCOSAL | 0 refills | Status: DC | PRN

## 2020-09-12 MED ORDER — LIDOCAINE VISCOUS HCL 2 % MT SOLN: 15.0000 mL | Freq: Four times a day (QID) | OROMUCOSAL | Status: DC | PRN

## 2020-09-12 MED ORDER — TIZANIDINE HCL 2 MG PO TABS
2.0000 mg | ORAL_TABLET | Freq: Three times a day (TID) | ORAL | Status: DC | PRN
  Administered 2020-09-13: 2 mg via ORAL
  Filled 2020-09-12: qty 1

## 2020-09-12 MED ORDER — MIRTAZAPINE 15 MG PO TBDP
45.0000 mg | ORAL_TABLET | Freq: Every day | ORAL | Status: DC
  Administered 2020-09-12: 45 mg via ORAL
  Filled 2020-09-12: qty 3

## 2020-09-12 MED ORDER — ALUM & MAG HYDROXIDE-SIMETH 200-200-20 MG/5ML PO SUSP
30.0000 mL | Freq: Once | ORAL | Status: AC
  Administered 2020-09-12: 30 mL via ORAL
  Filled 2020-09-12: qty 30

## 2020-09-12 MED ORDER — LIDOCAINE VISCOUS HCL 2 % MT SOLN
15.0000 mL | Freq: Once | OROMUCOSAL | Status: AC
  Administered 2020-09-12: 15 mL via ORAL
  Filled 2020-09-12: qty 15

## 2020-09-12 NOTE — ED Notes (Addendum)
Pt admitted to continuous observation due to worsening depression, anxiety, and SI. Pt A&O x4, calm and cooperative. Pt ambulated independently to unit. Oriented to unit/staff. No signs of acute distress noted. Will continue to monitor for safety.

## 2020-09-12 NOTE — ED Provider Notes (Signed)
Behavioral Health Admission [facility] & [facility])  Date: 09/12/20 Patient Name: [name] MRN: [id number] Chief Complaint:  Chief Complaint  Patient presents with  . Anxiety  . Depression   Chief Complaint/Presenting Problem: depression and anxiety  Diagnoses:  Final diagnoses:  MDD (major depressive disorder), recurrent severe, without psychosis ([place])  GAD (generalized anxiety disorder)  Suicidal ideation  History of suicide attempt    HPI: [name], 45 y.o., male patient presents to [facility] as a walk-in with complaints of worsening anxiety, depression, and suicidal ideation.   Patient seen face to face by this provider, consulted with Dr. Serafina Mitchell; and chart reviewed on 09/12/20.  On evaluation [name] reports that he lives in [place], [place] and he was told to come here by his doctor related to hospitals in [place] being full at this time and unable to take him in.  Patient states that he was diagnosed with hiatal hernia which is the main stressor "I feel like my mental health is suffering due to my physical ailments.  I have lost a lot of weight having been able to eat, I am always nauseous or vomiting.  I do not know what else to do about having his bad thoughts might not want to wake up or just wanting to die.  I do not really have a plan."  Patient states he lives with his mother who is supportive patient reports he does have outpatient psychiatric services but would not be able to see his psychiatrist until February 10.  Patient reports he has a history of major depressive disorder and generalized anxiety disorder and that he is compliant with his psychotropic medications.  At the beginning of examination patient reported more passive suicidal thoughts but now stating that he is unable to contract for safety and if he was to go home he is not sure that he would not try to hurt or kill himself.  Patient reports he has history of prior suicide attempts and psychiatric  hospitalization.  Patient also informs that he has a prior history of self-harm.  States he had done well for 15 years but did cut himself about a month ago. During evaluation [name] is alert/oriented x 4; calm/cooperative; and mood is congruent with affect.  He does not appear to be responding to internal/external stimuli or delusional thoughts.  Patient denies homicidal ideation, psychosis, and paranoia.  Patient continues to endorse suicidal ideation with no specific plan but unable to contract for safety.  Patient answered question appropriately.    This patient was also seen in [facility] ED earlier today with no complaints of suicidal ideation only bad thoughts and complaints of nausea and stomach pain.  Per [place], [place] Note:   [name] is a 45 y.o. male with a history of hypertension, anxiety, previous suicide attempts.  Patient presents with a chief complaint of nausea and epigastric abdominal pain, symptoms have been present for the past 2 months, are constant, have been worsening, nausea is improved with Zofran, nausea and epigastric pain worse with eating.  Patient reports decreased p.o. intake due to his symptoms and states he has lost approximately 40 pounds over the last 2 months.  Patient reports coming to the emergency department today because he could not stand his nausea any longer.    Patient reports his chronic symptoms are causing "bad thoughts."  He reports he is tired of suffering and sometimes thinks would be better to "go to sleep but not wake up."  Patient denies  any plan for suicide.  Patient denies any homicidal ideations, auditory or visual hallucinations.  Patient reports that he is currently taking all his psychotropic medications as prescribed.  Per chart review on 09/04/20 patient had CT scan of abdomen and pelvis performed which showed partially visualized left large diaphragmatic hernia containing portions of the stomach, pancreas, spleen, adrenal glands and  colon.  He reports that yesterday he had a endoscopy performed which showed a hiatal hernia.    Per chart review patient presented to [facility] emergency department on 08/29/20 for similar symptoms and was admitted for 1 day.  Patient recently admitted for inpatient psychiatric management 2 months prior at [facility] due to suicide attempt via cutting left wrist.  Patient also has previous inpatient admissions with suicide attempt 20 years ago via hanging.     PHQ 2-9:  [place] ED from 09/12/2020 in [facility]  Thoughts that you would be better off dead, or of hurting yourself in some way More than half the days  [Phreesia 09/12/2020]  PHQ-9 Total Score 20      [place] ED from 09/12/2020 in [facility] Most recent reading at 09/12/2020  3:48 PM ED from 09/12/2020 in [place] DEPT Most recent reading at 09/12/2020  1:00 PM  C-SSRS RISK CATEGORY High Risk High Risk       Total Time spent with patient: 45 minutes  Musculoskeletal  Strength & Muscle Tone: within normal limits Gait & Station: normal Patient leans: N/A  Psychiatric Specialty Exam  Presentation General Appearance: Appropriate for Environment; Casual  Eye Contact:Good  Speech:Clear and Coherent; Normal Rate  Speech Volume:Normal  Handedness:Right   Mood and Affect  Mood:Anxious; Depressed; Hopeless  Affect:Depressed; Congruent   Thought Process  Thought Processes:Coherent; Goal Directed  Descriptions of Associations:Intact  Orientation:Full (Time, Place and Person)  Thought Content:WDL  Hallucinations:Hallucinations: None  Ideas of Reference:None  Suicidal Thoughts:Suicidal Thoughts: Yes, Active SI Active Intent and/or Plan: Without Intent; Without Plan  Homicidal Thoughts:Homicidal Thoughts: No   Sensorium  Memory:Immediate Good; Recent Good; Remote  Good  Judgment:Intact  Insight:Present   Executive Functions  Concentration:Good  Attention Span:Good  [place] recorded Language:Good   Psychomotor Activity  Psychomotor Activity:Psychomotor Activity: Normal   Assets  Assets:Communication Skills; Desire for Improvement; Financial Resources/Insurance; Housing; Social Support   Sleep  Sleep:Sleep: Good   Physical Exam Vitals and nursing note reviewed. Exam conducted with a chaperone present.  Constitutional:      General: He is not in acute distress.    Appearance: Normal appearance. He is obese. He is not ill-appearing.  HENT:     Head: Normocephalic.  Eyes:     Pupils: Pupils are equal, round, and reactive to light.  Cardiovascular:     Rate and Rhythm: Normal rate.  Pulmonary:     Effort: Pulmonary effort is normal.  Musculoskeletal:        General: Normal range of motion.     Cervical back: Normal range of motion.  Skin:    General: Skin is warm and dry.  Neurological:     Mental Status: He is alert and oriented to person, place, and time.  Psychiatric:        Attention and Perception: Attention and perception normal. He does not perceive auditory or visual hallucinations.        Mood and Affect: Mood is anxious and depressed. Affect is labile.  Speech: Speech normal.        Behavior: Behavior normal. Behavior is cooperative.        Thought Content: Thought content includes suicidal ideation. Thought content does not include suicidal plan.        Cognition and Memory: Cognition and memory normal.        Judgment: Judgment normal.    Review of Systems  Constitutional: Negative.   HENT: Negative.   Eyes: Negative.   Respiratory: Negative.   Cardiovascular: Negative.   Gastrointestinal: Positive for abdominal pain, heartburn and nausea.       Patient reporting hiatal hernia that needs repair and is suppose to have surgery but no date set at this time   Genitourinary: Negative.   Musculoskeletal: Negative.   Skin: Negative.   Neurological: Negative.   Endo/Heme/Allergies: Negative.   Psychiatric/Behavioral: Positive for depression and suicidal ideas. Negative for hallucinations. Substance abuse: Denies. The patient is nervous/anxious.     Blood pressure 124/87, pulse 86, temperature 98 F (36.7 C), temperature source Tympanic, resp. rate 18, SpO2 98 %. There is no height or weight on file to calculate BMI.  Past Psychiatric History: Major depressive disorder, generalized anxiety disorder  Is the patient at risk to self? Yes  Has the patient been a risk to self in the past 6 months? Yes .    Has the patient been a risk to self within the distant past? Yes   Is the patient a risk to others? No   Has the patient been a risk to others in the past 6 months? No   Has the patient been a risk to others within the distant past? No   Past Medical History: History reviewed. No pertinent past medical history. History reviewed. No pertinent surgical history.  Family History: History reviewed. No pertinent family history.  Social History:  Social History   Socioeconomic History  . Marital status: Single    Spouse name: Not on file  . Number of children: Not on file  . Years of education: Not on file  . Highest education level: Not on file  Occupational History  . Not on file  Tobacco Use  . Smoking status: Current Every Day Smoker    Packs/day: 0.50    Types: Cigarettes  . Smokeless tobacco: Never Used  Substance and Sexual Activity  . Alcohol use: Not Currently  . Drug use: Not Currently  . Sexual activity: Not on file  Other Topics Concern  . Not on file  Social History Narrative  . Not on file   Social Determinants of Health   Financial Resource Strain: Not on file  Food Insecurity: Not on file  Transportation Needs: Not on file  Physical Activity: Not on file  Stress: Not on file  Social Connections: Not on file   Intimate Partner Violence: Not on file    SDOH:  SDOH Screenings   Alcohol Screen: Not on file  Depression (PHQ2-9): Medium Risk  . PHQ-2 Score: 20  Financial Resource Strain: Not on file  Food Insecurity: Not on file  Housing: Not on file  Physical Activity: Not on file  Social Connections: Not on file  Stress: Not on file  Tobacco Use: High Risk  . Smoking Tobacco Use: Current Every Day Smoker  . Smokeless Tobacco Use: Never Used  Transportation Needs: Not on file    Last Labs:  No visits with results within 6 Month(s) from this visit.  Latest known visit with results is:  [facility]  Outpatient Visit on 02/06/2010  Component Date Value Ref Range Status  . Opiates 02/06/2010 NONE DETECTED  NONE DETECTED Final  . Cocaine 02/06/2010 NONE DETECTED  NONE DETECTED Final  . Benzodiazepines 02/06/2010 POSITIVE* NONE DETECTED Final  . Amphetamines 02/06/2010 NONE DETECTED  NONE DETECTED Final  . Tetrahydrocannabinol 02/06/2010 NONE DETECTED  NONE DETECTED Final  . Barbiturates 02/06/2010   NONE DETECTED Final                   Value:NONE DETECTED                                DRUG SCREEN FOR MEDICAL PURPOSES                         ONLY.  IF CONFIRMATION IS NEEDED                         FOR ANY PURPOSE, NOTIFY LAB                         WITHIN 5 DAYS.                                                        LOWEST DETECTABLE LIMITS                         FOR URINE DRUG SCREEN                         Drug Class       Cutoff (ng/mL)                         Amphetamine      1000                         Barbiturate      200                         Benzodiazepine   200                         Tricyclics       381                         Opiates          300                         Cocaine          300                         THC              50  . TCA Scrn 02/06/2010   NONE DETECTED Final                   Value:NONE DETECTED  LOWEST DETECTABLE  LIMITS                         FOR URINE DRUG SCREEN                         Drug Class       Cutoff (ng/mL)                         Tricyclics       388  . Color, Urine 02/06/2010 YELLOW  YELLOW Final  . APPearance 02/06/2010 CLEAR  CLEAR Final  . Specific Gravity, Urine 02/06/2010 1.018  1.005 - 1.030 Final  . pH 02/06/2010 6.0  5.0 - 8.0 Final  . Glucose, UA 02/06/2010 NEGATIVE  NEGATIVE mg/dL Final  . Hgb urine dipstick 02/06/2010 NEGATIVE  NEGATIVE Final  . Bilirubin Urine 02/06/2010 NEGATIVE  NEGATIVE Final  . Ketones, ur 02/06/2010 NEGATIVE  NEGATIVE mg/dL Final  . Protein, ur 02/06/2010 NEGATIVE  NEGATIVE mg/dL Final  . Urobilinogen, UA 02/06/2010 0.2  0.0 - 1.0 mg/dL Final  . Nitrite 02/06/2010 NEGATIVE  NEGATIVE Final  . Leukocytes, UA 02/06/2010 NEGATIVE MICROSCOPIC NOT DONE ON URINES WITH NEGATIVE PROTEIN, BLOOD, LEUKOCYTES, NITRITE, OR GLUCOSE <1000 mg/dL.  NEGATIVE Final  . Alcohol, Ethyl (B) 02/06/2010   0 - 10 mg/dL Final                   Value:<5                                LOWEST DETECTABLE LIMIT FOR                         SERUM ALCOHOL IS 5 mg/dL                         FOR MEDICAL PURPOSES ONLY  . Sodium 02/06/2010 141  135 - 145 mEq/L Final  . Potassium 02/06/2010 3.8  3.5 - 5.1 mEq/L Final  . Chloride 02/06/2010 105  96 - 112 mEq/L Final  . CO2 02/06/2010 28  19 - 32 mEq/L Final  . Glucose, Bld 02/06/2010 79  70 - 99 mg/dL Final  . BUN 02/06/2010 12  6 - 23 mg/dL Final  . Creatinine, Ser 02/06/2010 0.90  0.4 - 1.5 mg/dL Final  . Calcium 02/06/2010 9.7  8.4 - 10.5 mg/dL Final  . GFR calc non Af Amer 02/06/2010 >60  >60 mL/min Final  . GFR calc Af Amer 02/06/2010   >60 mL/min Final                   Value:>60                                The eGFR has been calculated                         using the MDRD equation.                         This calculation has not been  validated in all clinical                         situations.                          eGFR's persistently                         <60 mL/min signify                         possible Chronic Kidney Disease.  . WBC 02/06/2010 10.8* 4.0 - 10.5 K/uL Final  . RBC 02/06/2010 5.56  4.22 - 5.81 MIL/uL Final  . Hemoglobin 02/06/2010 16.1  13.0 - 17.0 g/dL Final  . HCT 02/06/2010 48.0  39.0 - 52.0 % Final  . MCV 02/06/2010 86.3  78.0 - 100.0 fL Final  . MCH 02/06/2010 28.9  26.0 - 34.0 pg Final  . MCHC 02/06/2010 33.5  30.0 - 36.0 g/dL Final  . RDW 02/06/2010 13.6  11.5 - 15.5 % Final  . Platelets 02/06/2010 224 SPECIMEN CHECKED FOR CLOTS  150 - 400 K/uL Final  . Neutrophils Relative % 02/06/2010 66  43 - 77 % Final  . Lymphocytes Relative 02/06/2010 23  12 - 46 % Final  . Monocytes Relative 02/06/2010 8  3 - 12 % Final  . Eosinophils Relative 02/06/2010 2  0 - 5 % Final  . Basophils Relative 02/06/2010 1  0 - 1 % Final  . Neutro Abs 02/06/2010 7.1  1.7 - 7.7 K/uL Final  . Lymphs Abs 02/06/2010 2.5  0.7 - 4.0 K/uL Final  . Monocytes Absolute 02/06/2010 0.9  0.1 - 1.0 K/uL Final  . Eosinophils Absolute 02/06/2010 0.2  0.0 - 0.7 K/uL Final  . Basophils Absolute 02/06/2010 0.1  0.0 - 0.1 K/uL Final  . Smear Review 02/06/2010    Final                   Value:PLATELET CLUMPS NOTED ON SMEAR, COUNT APPEARS ADEQUATE                         LARGE PLATELETS PRESENT                         SPECIMEN CHECKED FOR CLOTS    Allergies: Compazine [prochlorperazine], Lamotrigine, and Risperidone  PTA Medications: (Not in a hospital admission)   Medical Decision Making  Patient admitted to continuous assessment unit while awaiting appropriate bed for psychiatric hospitalization  Lab Orders     Resp Panel by RT-PCR (Flu A&B, Covid) Nasopharyngeal Swab     CBC with Differential/Platelet     Comprehensive metabolic panel     Hemoglobin A1c     Magnesium     Ethanol     Lipid panel     TSH     Urinalysis, Routine w reflex microscopic Urine, Clean Catch     POC SARS  Coronavirus 2 Ag-ED - Nasal Swab     POCT Urine Drug Screen - ([facility])    Will order home medications once med reconciliation is completed. Patient on multiple medications.  Stating he is taking Ativan 1.5 mg 4 times daily and Librium 50 mg 4 times daily.  PDMR shows that Ativan has not been filled since 06/2020 and Librium started  08/2020.  Medications list sent from Pharmacy [facility] [place], [id number]   Meds ordered this encounter  Medications  . dicyclomine (BENTYL) tablet 20 mg  . gabapentin (NEURONTIN) capsule 400 mg  . lidocaine (XYLOCAINE) 2 % viscous mouth solution 15 mL  . LORazepam (ATIVAN) tablet 1.5 mg  . metoCLOPramide (REGLAN) tablet 10 mg  . metoprolol tartrate (LOPRESSOR) tablet 25 mg  . mirtazapine (REMERON SOL-TAB) disintegrating tablet 45 mg  . OLANZapine (ZYPREXA) tablet 5 mg  . ondansetron (ZOFRAN) tablet 4 mg  . pantoprazole (PROTONIX) EC tablet 40 mg  . tizanidine (ZANAFLEX) capsule 2 mg   It appears that recent medication changes were made to patients psychotropics on 09/11/20.  No changes made at this time.    Recommendations  Based on my evaluation the patient does not appear to have an emergency medical condition.  Tineka Uriegas, NP 09/12/20  5:14 PM

## 2020-09-12 NOTE — BH Assessment (Addendum)
Comprehensive Clinical Assessment (CCA) Note  09/12/2020 [name] [id number]  [name] is a 45 year old male presenting to [facility] voluntarily with chief complaint of worsening depression and anxiety after learning he has a hiatal hernia; possibly requiring surgery. Patient reports difficulty eating, vomiting and losing weight due to hernia. Patient state "my mental health is suffering due to my illness" and reports symptoms of depression and anxiety. Patient reports going to hospital in [place] but due to high COVID cases patient reports ED recommended him come to [facility] for treatment. Patient reports calling a "buddy" to bring him to [facility] for treatment.   Patient reports diagnosis of depression, GAD, panic disorder and OCD. Patient reports being followed by psychiatry at [facility]. Patient reports calling to see if he can get an earlier appointment to discuss his medications but the earliest apportionment, they had was for 09/21/20. Patient reports compliance with his medications and list Ativan, Zyprexa, Remeron and gabapentin as some of his medications. Patient is not seeing a therapist currently but has had therapy in the past. Patient consents for TTS to call his mother [name] [phone]) for collateral information, however patient mother did not answer  and HIPPA compliant message left on voicemail.  Patient is oriented to person place and situation; he is engaged, alert and cooperative during assessment. Patient eye contact and tone of voice is normal, patient thoughts are liner, and his mood is appropriate and pleasant. Patient reports passive SI "I want to sleep and not wake up" and denies having a plan. Patient reports one suicidal attempt 20 years ago by trying to hang himself. Patient also reports history of SIB by cutting his arm several months ago. Patient unable to contract for safety stating that he does not feel safe going home.  Patient denies HI/AVH and substance use.  Per Assunta Found, NP, patient is recommended for inpatient treatment.        Chief Complaint:  Chief Complaint  Patient presents with  . Anxiety  . Depression   Visit Diagnosis: Major depressive disorder, recurrent severe without psychotic features     CCA Screening, Triage and Referral (STR)  Patient Reported Information How did you hear about [place]? Family/Friend (Phreesia 09/12/2020)  Referral name: Na (Phreesia 09/12/2020)  Referral phone number: No data recorded  Whom do you see for routine medical problems? Other (Comment) (Phreesia 09/12/2020)  Practice/Facility Name: [facility] (Phreesia 09/12/2020)  Practice/Facility Phone Number: No data recorded Name of Contact: No data recorded Contact Number: No data recorded Contact Fax Number: No data recorded Prescriber Name: No data recorded Prescriber Address (if known): No data recorded  What Is the Reason for Your Visit/Call Today? Bad Thoughts (Phreesia 09/12/2020)  How Long Has This Been Causing You Problems? 1 wk - 1 month (Phreesia 09/12/2020)  What Do You Feel Would Help You the Most Today? Medication (Phreesia 09/12/2020)   Have You Recently Been in Any Inpatient Treatment (Hospital/Detox/Crisis Center/28-Day Program)? No (Phreesia 09/12/2020)  Name/Location of Program/Hospital:No data recorded How Long Were You There? No data recorded When Were You Discharged? No data recorded  Have You Ever Received Services From [facility] Before? No (Phreesia 09/12/2020)  Who Do You See at [facility]? No data recorded  Have You Recently Had Any Thoughts About Hurting Yourself? Yes (Phreesia 09/12/2020)  Are You Planning to Commit Suicide/Harm Yourself At This time? No (Phreesia 09/12/2020)   Have you Recently Had Thoughts About Hurting Someone Karolee Ohs? No (Phreesia 09/12/2020)  Explanation:  No data recorded  Have You Used Any Alcohol or Drugs in the Past 24 Hours? No  (Phreesia 09/12/2020)  How Long Ago Did You Use Drugs or Alcohol? No data recorded What Did You Use and How Much? No data recorded  Do You Currently Have a Therapist/Psychiatrist? Yes (Phreesia 09/12/2020)  Name of Therapist/Psychiatrist: [facility] (Phreesia 09/12/2020)   Have You Been Recently Discharged From Any Office Practice or Programs? No (Phreesia 09/12/2020)  Explanation of Discharge From Practice/Program: No data recorded    CCA Screening Triage Referral Assessment Type of Contact: Face-to-Face  Is this Initial or Reassessment? No data recorded Date Telepsych consult ordered in [facility]:  No data recorded Time Telepsych consult ordered in [facility]:  No data recorded  Patient Reported Information Reviewed? Yes  Patient Left Without Being Seen? No data recorded Reason for Not Completing Assessment: No data recorded  Collateral Involvement: Mother [name] [phone]   Does Patient Have a Court Appointed Legal Guardian? No data recorded Name and Contact of Legal Guardian: No data recorded If Minor and Not Living with Parent(s), Who has Custody? No data recorded Is CPS involved or ever been involved? No data recorded Is APS involved or ever been involved? No data recorded  Patient Determined To Be At Risk for Harm To Self or Others Based on Review of Patient Reported Information or Presenting Complaint? Yes, for Self-Harm  Method: No data recorded Availability of Means: No data recorded Intent: No data recorded Notification Required: No data recorded Additional Information for Danger to Others Potential: No data recorded Additional Comments for Danger to Others Potential: No data recorded Are There Guns or Other Weapons in Your Home? No data recorded Types of Guns/Weapons: No data recorded Are These Weapons Safely Secured?                            No data recorded Who Could Verify You Are Able To Have These Secured: No data recorded Do You  Have any Outstanding Charges, Pending Court Dates, Parole/Probation? No data recorded Contacted To Inform of Risk of Harm To Self or Others: No data recorded  Location of Assessment: [facility]   Does Patient Present under Involuntary Commitment? No  IVC Papers Initial File Date: No data recorded  [place] of Residence: [place] (Comment) [place]   Patient Currently Receiving the Following Services: Medication Management   Determination of Need: Emergent (2 hours)   Options For Referral: Outpatient Therapy     CCA Biopsychosocial Intake/Chief Complaint:  depression and anxiety  Current Symptoms/Problems: depression and anxiety   Patient Reported Schizophrenia/Schizoaffective Diagnosis in Past: No   Strengths: UTA  Preferences: UTA  Abilities: UTA   Type of Services Patient Feels are Needed: No data recorded  Initial Clinical Notes/Concerns: No data recorded  Mental Health Symptoms Depression:  Difficulty Concentrating; Tearfulness; Sleep (too much or little); Weight gain/loss   Duration of Depressive symptoms: Greater than two weeks   Mania:  None   Anxiety:   Difficulty concentrating; Worrying; Tension   Psychosis:  None   Duration of Psychotic symptoms: No data recorded  Trauma:  N/A   Obsessions:  N/A   Compulsions:  N/A   Inattention:  N/A   Hyperactivity/Impulsivity:  N/A   Oppositional/Defiant Behaviors:  N/A   Emotional Irregularity:  N/A   Other Mood/Personality Symptoms:  No data recorded   Mental Status Exam Appearance and self-care  Stature:  Average  Weight:  Overweight   Clothing:  Age-appropriate   Grooming:  Normal   Cosmetic use:  None   Posture/gait:  Normal   Motor activity:  Not Remarkable   Sensorium  Attention:  Normal   Concentration:  Normal   Orientation:  X5   Recall/memory:  Normal   Affect and Mood  Affect:  Congruent; Appropriate   Mood:  No data recorded   Relating  Eye contact:  Normal   Facial expression:  Responsive   Attitude toward examiner:  Cooperative   Thought and Language  Speech flow: Clear and Coherent   Thought content:  Appropriate to Mood and Circumstances   Preoccupation:  None   Hallucinations:  None   Organization:  No data recorded  [employer] of Knowledge:  Good   Intelligence:  Average   Abstraction:  Normal   Judgement:  Fair   Reality Testing:  Adequate   Insight:  Fair   Decision Making:  Normal   Social Functioning  Social Maturity:  Responsible   Social Judgement:  Normal   Stress  Stressors:  Illness   Coping Ability:  Human resources officer Deficits:  None   Supports:  Friends/Service system; Family     Religion:    Leisure/Recreation:    Exercise/Diet:     CCA Employment/Education Employment/Work Situation: Employment / Work Situation Employment situation: On disability Why is patient on disability: UTA How long has patient been on disability: UTA  Education: Education Is Patient Currently Attending School?: No   CCA Family/Childhood History Family and Relationship History: Family history What is your sexual orientation?: UTA Has your sexual activity been affected by drugs, alcohol, medication, or emotional stress?: UTA  Childhood History:  Childhood History Additional childhood history information: UTA Description of patient's relationship with caregiver when they were a child: UTA Patient's description of current relationship with people who raised him/her: UTA How were you disciplined when you got in trouble as a child/adolescent?: UTA  Child/Adolescent Assessment:     CCA Substance Use Alcohol/Drug Use: Alcohol / Drug Use Pain Medications: See MAR Prescriptions: See MAR Over the Counter: See MAR History of alcohol / drug use?: No history of alcohol / drug abuse                         ASAM's:  Six Dimensions of  Multidimensional Assessment  Dimension 1:  Acute Intoxication and/or Withdrawal Potential:      Dimension 2:  Biomedical Conditions and Complications:      Dimension 3:  Emotional, Behavioral, or Cognitive Conditions and Complications:     Dimension 4:  Readiness to Change:     Dimension 5:  Relapse, Continued use, or Continued Problem Potential:     Dimension 6:  Recovery/Living Environment:     ASAM Severity Score:    ASAM Recommended Level of Treatment:     Substance use Disorder (SUD)    Recommendations for Services/Supports/Treatments:    DSM5 Diagnoses: Patient Active Problem List   Diagnosis Date Noted  . MDD (major depressive disorder), recurrent severe, without psychosis (HCC) 09/12/2020  . GAD (generalized anxiety disorder) 09/12/2020  . Suicidal ideation 09/12/2020  . History of suicide attempt 09/12/2020  . ANXIETY 05/30/2009  . ESSENTIAL HYPERTENSION, BENIGN 05/30/2009  . PALPITATIONS, RECURRENT 05/30/2009    Per Denice Bors Rankin, NP, patient is recommended for inpatient treatment.     Falencio Shirlee More, [facility]

## 2020-09-12 NOTE — ED Provider Notes (Signed)
[facility]-EMERGENCY DEPT Provider Note   CSN: [id number] Arrival date & time: 09/12/20  1225     History Chief Complaint  Patient presents with  . Suicidal  . Abdominal Pain  . Nausea  . Anxiety    [name] is a 45 y.o. male with a history of hypertension, anxiety, previous suicide attempts.  Patient presents with a chief complaint of nausea and epigastric abdominal pain, symptoms have been present for the past 2 months, are constant, have been worsening, nausea is improved with Zofran, nausea and epigastric pain worse with eating.  Patient reports decreased p.o. intake due to his symptoms and states he has lost approximately 40 pounds over the last 2 months.  Patient reports coming to the emergency department today because he could not stand his nausea any longer.    Patient reports his chronic symptoms are causing "bad thoughts."  He reports he is tired of suffering and sometimes thinks would be better to "go to sleep but not wake up."  Patient denies any plan for suicide.  Patient denies any homicidal ideations, auditory or visual hallucinations.  Patient reports that he is currently taking all his psychotropic medications as prescribed.  Per chart review on 09/04/20 patient had CT scan of abdomen and pelvis performed which showed partially visualized left large diaphragmatic hernia containing portions of the stomach, pancreas, spleen, adrenal glands and colon.  He reports that yesterday he had a endoscopy performed which showed a hiatal hernia.    Per chart review patient presented to [facility] emergency department on 08/29/20 for similar symptoms and was admitted for 1 day.  Patient recently admitted for inpatient psychiatric management 2 months prior at [facility] due to suicide attempt via cutting left wrist.  Patient also has previous inpatient admissions with suicide attempt 20 years ago via hanging.    HPI     History reviewed. No pertinent past  medical history.  Patient Active Problem List   Diagnosis Date Noted  . ANXIETY 05/30/2009  . ESSENTIAL HYPERTENSION, BENIGN 05/30/2009  . PALPITATIONS, RECURRENT 05/30/2009    History reviewed. No pertinent surgical history.     No family history on file.  Social History   Tobacco Use  . Smoking status: Current Every Day Smoker    Packs/day: 0.50    Types: Cigarettes  . Smokeless tobacco: Never Used  Substance Use Topics  . Alcohol use: Not Currently  . Drug use: Not Currently    Home Medications Prior to Admission medications   Not on File    Allergies    Lamotrigine and Risperidone  Review of Systems   Review of Systems  Constitutional: Negative for chills and fever.  HENT: Negative for trouble swallowing.   Eyes: Negative for visual disturbance.  Respiratory: Negative for shortness of breath.   Cardiovascular: Negative for chest pain.  Gastrointestinal: Positive for abdominal pain (epigastric) and nausea. Negative for abdominal distention, anal bleeding, constipation, diarrhea and vomiting.  Genitourinary: Negative for difficulty urinating and dysuria.  Musculoskeletal: Negative for back pain and neck pain.  Skin: Negative for color change and rash.  Neurological: Negative for dizziness, syncope, light-headedness and headaches.  Psychiatric/Behavioral: Negative for confusion, hallucinations and suicidal ideas. The patient is not nervous/anxious.     Physical Exam Updated Vital Signs BP (!) 140/95 (BP Location: Right Arm)   Pulse 89   Temp 98.4 F (36.9 C) (Oral)   Resp 19   SpO2 98%   Physical Exam Vitals and nursing note reviewed.  Constitutional:      General: He is not in acute distress.    Appearance: He is not ill-appearing, toxic-appearing or diaphoretic.  HENT:     Head: Normocephalic.  Eyes:     General: No scleral icterus.       Right eye: No discharge.        Left eye: No discharge.  Cardiovascular:     Rate and Rhythm: Normal rate.   Pulmonary:     Effort: Pulmonary effort is normal. No respiratory distress.     Breath sounds: No stridor.  Abdominal:     General: Abdomen is protuberant. Bowel sounds are normal. There is no distension. There are no signs of injury.     Palpations: Abdomen is soft. There is no mass or pulsatile mass.     Tenderness: There is abdominal tenderness in the epigastric area. There is rebound. There is no guarding.  Musculoskeletal:     Cervical back: Neck supple.  Skin:    General: Skin is warm and dry.     Coloration: Skin is not jaundiced or pale.  Neurological:     General: No focal deficit present.     Mental Status: He is alert.  Psychiatric:        Behavior: Behavior is cooperative.     ED Results / Procedures / Treatments   Labs (all labs ordered are listed, but only abnormal results are displayed) Labs Reviewed - No data to display  EKG None  Radiology No results found.  Procedures Procedures   Medications Ordered in ED Medications  alum & mag hydroxide-simeth (MAALOX/MYLANTA) 200-200-20 MG/5ML suspension 30 mL (has no administration in time range)    And  lidocaine (XYLOCAINE) 2 % viscous mouth solution 15 mL (has no administration in time range)  ondansetron (ZOFRAN) tablet 4 mg (has no administration in time range)    ED Course  I have reviewed the triage vital signs and the nursing notes.  Pertinent labs & imaging results that were available during my care of the patient were reviewed by me and considered in my medical decision making (see chart for details).    MDM Rules/Calculators/A&P                          Alert 45 year old male in no acute distress, nontoxic appearing.  Patient presents with chief complaint of nausea and epigastric pain.  Per chart review patient had recent CT scan of abdomen and pelvis which showed partially visualized left large diaphragmatic hernia containing portions of the stomach, pancreas, spleen, adrenal glands and colon.   Additionally patient reports that he had a endoscopy performed yesterday which she states "showed a hiatal hernia."  Patient denies any change in his symptoms.  Patient reports that he came to the hospital today because he could not stand his nausea any longer.  Patient has Zofran prescription and states Zofran has been helping with his nausea.  Abdomen is soft, nondistended, normoactive bowel sounds, tenderness to epigastric area, no guarding, no mass.  Due to recent imaging and no change in patient's symptoms he does not warrants additional work-up at this time.  Patient was given GI cocktail and Zofran.  Patient able to tolerate p.o. fluids without difficulty.  Patient reports improvement in his symptoms.  Will have patient follow-up with his primary care provider and other providers managing his GI care.  Patient prescribed viscous lidocaine.  Patient also expressed that his medical condition is causing "  bad thoughts."  When asked to elaborate on this patient states he is tired of suffering and sometimes thinks would be better to "go to sleep but not wake up."  Patient denies any plan for suicide.  Patient denies any homicidal ideations, auditory or visual hallucinations.  Patient reports that he is currently taking all his psychotropic medications as prescribed.  Patient reports he has a psychiatrist and will follow up with them.  Patient also reports he will follow up with behavioral health urgent care.  Plan is appropriate for patient's passive suicidality.  Patient and treatment plan were discussed with Dr. Rhunette Croft.   Final Clinical Impression(s) / ED Diagnoses Final diagnoses:  Nausea  Epigastric pain  Passive suicidal ideations    Rx / DC Orders ED Discharge Orders         Ordered    lidocaine (XYLOCAINE) 2 % solution  Every 6 hours PRN        09/12/20 1510           Haskel Schroeder, PA-C 09/13/20 Gwenyth Bender, MD 09/13/20 [phone]

## 2020-09-12 NOTE — ED Notes (Signed)
Patient belongings in locker 29 

## 2020-09-12 NOTE — Discharge Instructions (Addendum)
You presented to the emergency department for complaints of continued nausea and gastric pain.  You were given a GI cocktail and Zofran to help with your symptoms.  Please follow-up with your due providers for further management of your symptoms and hiatal hernia.  Continue to take your Zofran as prescribed.  Please keep with your psychiatrist for further management of your patient and anxiety.  You may take your psychotropic medications as prescribed.  Please return to the emergency department if your symptoms worsen, you note persistent vomiting will not stop, you have any thoughts of wanting to kill yourself or others.

## 2020-09-12 NOTE — ED Notes (Addendum)
Pt states he does not take this medication scheduled, he wants it PRN. Nira Conn, NP made aware.

## 2020-09-12 NOTE — ED Triage Notes (Signed)
Patient reports he has a hiatial hernia that was diagnosed yesterday and needs surgery per patient.   Patient c/o pain, weight loss, nausea, and emesis with some blood tinge.   Patient reports "bad thoughts" due to depression from health issues.   Patient reports SI thoughts with no plan. Denies HI.    A/ox4 Ambulatory in triage

## 2020-09-12 NOTE — ED Notes (Signed)
Pt asleep in bed. Respirations even and unlabored. Will continue to monitor for safety. ?

## 2020-09-12 NOTE — ED Notes (Signed)
Called [facility] for STAT pickup

## 2020-09-12 NOTE — ED Triage Notes (Signed)
Patient presents as a walking to the [facility] voluntarily, stating increased anxiety and depression due to having a hernia which causes him to have constant vomiting.

## 2020-09-12 NOTE — ED Notes (Signed)
Patient being intrusive and asking every staff member about his med list after being asked to wait. Ordered meds explained to patient who verbalized understanding.

## 2020-09-12 NOTE — Discharge Instructions (Addendum)

## 2020-09-13 DIAGNOSIS — F411 Generalized anxiety disorder: Secondary | ICD-10-CM | POA: Diagnosis not present

## 2020-09-13 DIAGNOSIS — Z9151 Personal history of suicidal behavior: Secondary | ICD-10-CM | POA: Diagnosis not present

## 2020-09-13 DIAGNOSIS — R45851 Suicidal ideations: Secondary | ICD-10-CM | POA: Diagnosis not present

## 2020-09-13 DIAGNOSIS — F332 Major depressive disorder, recurrent severe without psychotic features: Secondary | ICD-10-CM | POA: Diagnosis not present

## 2020-09-13 MED ORDER — NICOTINE 14 MG/24HR TD PT24
14.0000 mg | MEDICATED_PATCH | Freq: Every day | TRANSDERMAL | Status: DC
  Administered 2020-09-13: 14 mg via TRANSDERMAL
  Filled 2020-09-13: qty 1

## 2020-09-13 NOTE — ED Notes (Signed)
Pt given breakfast.

## 2020-09-13 NOTE — ED Notes (Signed)
Pt walked up to nurse's station requesting Zanaflex stating he is having muscle spasms in his neck. PRN Zanaflex given.

## 2020-09-13 NOTE — ED Provider Notes (Signed)
FBC/OBS ASAP Discharge Summary  Date and Time: 09/13/2020 6:43 PM  Name: [name]  MRN:  [id number]   Discharge Diagnoses:  Final diagnoses:  MDD (major depressive disorder), recurrent severe, without psychosis (HCC)  GAD (generalized anxiety disorder)  Suicidal ideation  History of suicide attempt    Subjective:  Pt interviewed this AM bedside. He is calm, cooperative and pleasant. He denies SI/HI/AVH and recalls what brought him in. States that his hernia and abdominal pain is his main issue and describes low mood being secondary to this. States hat he feels safe for discharge. Pt lives in [place] and has a psychiatrist, psych MD appt on 2/10. Provides consent to speak with mother with whom he lives.   Peggy-Mother (pt lives with her) [phone] Called at 10:49 AM States that she does not have any safety concerns about him being discharged. He needs surgery on a hernia and would like him to come home asap so that they can get the surgery. He is very stressed out and overwhelmed. He lives with her. No safety concerns at this time and says will bring him to the hospital if there are safety concerns.   Stay Summary:  Pt presented on 2/1 with SI. On evaluation Akiva Brassfield reports that he lives in [place], [place] and he was told to come here by his doctor related to hospitals in [place] being full at this time and unable to take him in.  Patient states that he was diagnosed with hiatal hernia which is the main stressor "I feel like my mental health is suffering due to my physical ailments.  I have lost a lot of weight having been able to eat, I am always nauseous or vomiting.  I do not know what else to do about having his bad thoughts might not want to wake up or just wanting to die.  I do not really have a plan."  Patient states he lives with his mother who is supportive patient reports he does have outpatient psychiatric services but would not be able to see his psychiatrist until February 10.   Patient reports he has a history of major depressive disorder and generalized anxiety disorder and that he is compliant with his psychotropic medications.  At the beginning of examination patient reported more passive suicidal thoughts but now stating that he is unable to contract for safety and if he was to go home he is not sure that he would not try to hurt or kill himself.  Patient reports he has history of prior suicide attempts and psychiatric hospitalization.  Patient also informs that he has a prior history of self-harm.  States he had done well for 15 years but did cut himself about a month ago.  Patient admitted for overnight observation for safety and stabilization. Home medications restarted. The following day, patient denied SI/HI/AVH and was able to contract for safety. Collateral reported no safety concerns.    Total Time spent with patient: 15 minutes  Past Psychiatric History: see H&P Past Medical History: History reviewed. No pertinent past medical history. History reviewed. No pertinent surgical history. Family History: History reviewed. No pertinent family history. Family Psychiatric History: see H&P Social History:  Social History   Substance and Sexual Activity  Alcohol Use Not Currently     Social History   Substance and Sexual Activity  Drug Use Not Currently    Social History   Socioeconomic History  . Marital status: Single    Spouse name: Not on file  .  Number of children: Not on file  . Years of education: Not on file  . Highest education level: Not on file  Occupational History  . Not on file  Tobacco Use  . Smoking status: Current Every Day Smoker    Packs/day: 0.50    Types: Cigarettes  . Smokeless tobacco: Never Used  Substance and Sexual Activity  . Alcohol use: Not Currently  . Drug use: Not Currently  . Sexual activity: Not on file  Other Topics Concern  . Not on file  Social History Narrative  . Not on file   Social Determinants of  Health   Financial Resource Strain: Not on file  Food Insecurity: Not on file  Transportation Needs: Not on file  Physical Activity: Not on file  Stress: Not on file  Social Connections: Not on file   SDOH:  SDOH Screenings   Alcohol Screen: Not on file  Depression (PHQ2-9): Medium Risk  . PHQ-2 Score: 20  Financial Resource Strain: Not on file  Food Insecurity: Not on file  Housing: Not on file  Physical Activity: Not on file  Social Connections: Not on file  Stress: Not on file  Tobacco Use: High Risk  . Smoking Tobacco Use: Current Every Day Smoker  . Smokeless Tobacco Use: Never Used  Transportation Needs: Not on file    Has this patient used any form of tobacco in the last 30 days? (Cigarettes, Smokeless Tobacco, Cigars, and/or Pipes) Prescription not provided because: n/a  Current Medications:  No current facility-administered medications for this encounter.   Current Outpatient Medications  Medication Sig Dispense Refill  . gabapentin (NEURONTIN) 400 MG capsule Take 400 mg by mouth 3 (three) times daily.    Marland Kitchen LORazepam (ATIVAN) 1 MG tablet Take 1.5 mg by mouth 4 (four) times daily.    . metoprolol tartrate (LOPRESSOR) 25 MG tablet Take 25 mg by mouth 2 (two) times daily.    . mirtazapine (REMERON SOL-TAB) 45 MG disintegrating tablet Take 45 mg by mouth at bedtime. Place on tonque every night for 14 days (started 09/11/20)    . OLANZapine (ZYPREXA) 5 MG tablet Take 5 mg by mouth at bedtime. Q hs for 14 days (started 09/11/20)    . ondansetron (ZOFRAN-ODT) 4 MG disintegrating tablet Take 1 tablet by mouth every 6 (six) hours as needed. nausea    . pantoprazole (PROTONIX) 40 MG tablet Take 40 mg by mouth in the morning.    Marland Kitchen tiZANidine (ZANAFLEX) 2 MG tablet Take 1 tablet by mouth every 8 (eight) hours as needed.      PTA Medications: (Not in a hospital admission)   Musculoskeletal  Strength & Muscle Tone: within normal limits Gait & Station: normal Patient leans:  N/A  Psychiatric Specialty Exam  Presentation  General Appearance: Appropriate for Environment; Casual  Eye Contact:Good  Speech:Clear and Coherent; Normal Rate  Speech Volume:Normal  Handedness:Right   Mood and Affect  Mood:Euthymic  Affect:Appropriate; Congruent   Thought Process  Thought Processes:Coherent; Goal Directed; Linear  Descriptions of Associations:Intact  Orientation:Full (Time, Place and Person)  Thought Content:WDL  Hallucinations:Hallucinations: None  Ideas of Reference:None  Suicidal Thoughts:Suicidal Thoughts: No SI Active Intent and/or Plan: Without Intent; Without Plan  Homicidal Thoughts:Homicidal Thoughts: No   Sensorium  Memory:Immediate Good; Recent Good; Remote Good  Judgment:Good  Insight:Good   Executive Functions  Concentration:Good  Attention Span:Good  Recall:Good  Fund of Knowledge:Good  Language:Good   Psychomotor Activity  Psychomotor Activity:Psychomotor Activity: Normal   Assets  Assets:Communication Skills; Desire for Improvement; Financial Resources/Insurance; Housing; Social Support   Sleep  Sleep:Sleep: Fair   Physical Exam  Physical Exam Constitutional:      Appearance: Normal appearance. He is normal weight.  HENT:     Head: Normocephalic and atraumatic.  Pulmonary:     Effort: Pulmonary effort is normal.  Neurological:     Mental Status: He is alert.    Review of Systems  Constitutional: Negative for chills and fever.  Eyes: Negative for pain and discharge.  Cardiovascular: Negative for chest pain.  Gastrointestinal: Positive for abdominal pain.  Psychiatric/Behavioral: Negative for suicidal ideas.   Blood pressure 114/88, pulse 82, temperature 98.6 F (37 C), temperature source Oral, resp. rate 18, SpO2 97 %. There is no height or weight on file to calculate BMI.  Demographic Factors:  Male  Loss Factors: Decline in physical health  Historical Factors: NA  Risk Reduction  Factors:   Living with another person, especially a relative, Positive social support, Positive therapeutic relationship and Positive coping skills or problem solving skills  Continued Clinical Symptoms:  Depression:   Impulsivity  Cognitive Features That Contribute To Risk:  None    Suicide Risk:  Minimal: No identifiable suicidal ideation.  Patients presenting with no risk factors but with morbid ruminations; may be classified as minimal risk based on the severity of the depressive symptoms  Plan Of Care/Follow-up recommendations:  Activity:  as tolerated Diet:  regular Patient is instructed prior to discharge to: Take all medications as prescribed by his/her mental healthcare provider. Report any adverse effects and or reactions from the medicines to his/her outpatient provider promptly. Patient has been instructed & cautioned: To not engage in alcohol and or illegal drug use while on prescription medicines. In the event of worsening symptoms, patient is instructed to call the crisis hotline, 911 and or go to the nearest ED for appropriate evaluation and treatment of symptoms. To follow-up with his/her primary care provider for your other medical issues, concerns and or health care needs.   Recommended that patient follow up with regular psychiatrist.   Disposition: home  Estella Husk, MD 09/13/2020, 6:43 PM

## 2020-09-13 NOTE — Progress Notes (Signed)
Pt is sitting quietly, alert and oriented. No acute distress noted. Denies SI, HI and AVH. Staff will continue to monitor for safety.

## 2020-09-13 NOTE — Discharge Summary (Signed)
[name] is D/C'd home per MD order. Discussed with the patient and all questions fully answered. An After Visit Summary was printed and given to the patient.  Patient escorted out and D/C home via private auto.  [name]  09/13/2020 11:20 AM

## 2020-09-13 NOTE — ED Notes (Signed)
Pt asleep in bed. Respirations even and unlabored. Will continue to monitor for safety. ?

## 2021-01-01 ENCOUNTER — Emergency Department
Admission: EM | Admit: 2021-01-01 | Discharge: 2021-01-01 | Disposition: A | Discharge status: Home / Self Care | Payer: Medicare Other

## 2021-01-01 ENCOUNTER — Ambulatory Visit (HOSPITAL_COMMUNITY)
Admission: AD | Admit: 2021-01-01 | Discharge: 2021-01-01 | Disposition: A | Discharge status: Home / Self Care | Payer: Medicare Other | Attending: Psychiatry | Admitting: Psychiatry

## 2021-01-01 DIAGNOSIS — F332 Major depressive disorder, recurrent severe without psychotic features: Secondary | ICD-10-CM | POA: Insufficient documentation

## 2021-01-01 DIAGNOSIS — F411 Generalized anxiety disorder: Secondary | ICD-10-CM | POA: Diagnosis not present

## 2021-01-01 NOTE — H&P (Addendum)
Behavioral Health Medical Screening Exam  [name] is a 45 y.o. male.patient presented to [facility] as a walk in alone with complaints of "My meds have fizzled out".  [name], 45 y.o., male patient seen face to face by this provider, consulted with Dr. Lucianne Muss,  and chart reviewed on 01/01/21.  Patient reports he checked into the [facility] emergency department today, butt was taking too long, so he came directly to the [facility].  During evaluation [name] is in sitting position, eating a meal tray that he requested, he is in no acute distress.  He is alert, oriented x 4, calm, cooperative and attentive. His mood is depressed with congruent affect. Reports decreased appetite and sleep, but states he is hungry and continues eating tray. He has normal speech, and behavior.  Objectively there is no evidence of psychosis/mania or delusional thinking.  Patient is able to converse coherently, goal directed thoughts, no distractibility, or pre-occupation. He also denies self-harm/homicidal ideation, psychosis, and paranoia. Patient endorses "suicidal thoughts". States he has pain and depression, and he gets sick of it. States he has thoughts that "It would be easier if I just went to bed and didn't wake up". Patient denies having a plan for suicide, denies having means or access to weapons.  States, "I would never overdose or do anything to hurt myself".  Patient contracts for safety.  Patient states he does not have access to firearms/weapons.  Patient states he has a  history of MDD, GAD, and panic disorder. States since January he feels as though his depressive symptoms have worsened. Patient states  he has had 3 suicide attempts in the past. His last attempt was 15 years ago. States 2 attempts were by attempted hanging and one attempt he cut his wrist. Reports multiple inpatient psychiatric admissions. States the last admission was one week ago with [facility], states it was for "ECT".Denied that he  had suicidal ideations during that time. States he was in [facility] for one week.  Patient  Is requesting  that he be admitted to the [facility] States, "I think it could help me". Discussed admission criteria with patient. Informed patient he does not meet inpatient criteria. He became irritable. He states, "I don't understand why yall wont admit me if I am willing to voluntarily check my self in".  Discussed with patient that if he did not feel safe to return home we could transport him to the [facility] behavioral health urgent care for overnight observation. Patient stated, "I have done that before and it did not help".  Patient asked again why he cannot be admitted directly into [facility].  States, "this is why I came all the way here, to be directly admitted".  Patient declines any out patient resources.  Patient reports he does not live in this area and does not want outpatient resources.  States he only came to the [facility] for an inpatient admission. IOP/PHP was explained and offered to patient.  Patient states he completed those programs in [place] and they were not effective.  Patient states his outpatient provider is not effective.  Reports he does not want to do any type of outpatient therapy because it is not effective.  States he has tried it all and there is nothing that helps.  Patient again requested an inpatient hospital admission here at the [facility].  Informed patient that he would not be admitted.  He became agitated and stood up.  Stated, " then I guess we are done  here, I need go so I can call my ride".   Patient was asked again if he felt suicidal or homicidal, he stated no.  Stated he felt safe to return home and he contracted for safety.  States he has outpatient services in place.  Reports he follows up with Dr. Steffanie Dunn at [facility] in [place].  Patient has an appointment in 2 days.  Patient states he lives with his mother in [place].  Patient  states he is disabled.  He is not employed.  States a friend brought him and dropped him off here at the [facility].  Patient could  identify any immediate triggers/stressors and was vague through out assessment Patient appears to be seeking secondary gain.   Education provided:   The suicide prevention education provided includes the following:  Suicide risk factors  Suicide prevention and interventions  National Suicide Hotline telephone number  [facility] assessment telephone number  [facility] Emergency Assistance [address] and/or Residential Mobile Crisis Unit telephone number  Remove weapons (e.g., guns, rifles, knives), all items previously/currently identified as safety concern.  States he does not have access. Remove drugs/medications (over the counter, prescriptions, illicit drugs), all items previously/currently identified as a safety concern.   Total Time spent with patient: 20 minutes  Psychiatric Specialty Exam:  Presentation  General Appearance: Fairly Groomed  Eye Contact:Good  Speech:Clear and Coherent; Normal Rate  Speech Volume:Normal  Handedness:Right   Mood and Affect  Mood:Depressed; Irritable  Affect:Congruent   Thought Process  Thought Processes:Coherent  Descriptions of Associations:Intact  Orientation:Full (Time, Place and Person)  Thought Content:Logical  History of Schizophrenia/Schizoaffective disorder:No  Duration of Psychotic Symptoms:No data recorded Hallucinations:Hallucinations: None  Ideas of Reference:None  Suicidal Thoughts:Suicidal Thoughts: Yes, Passive SI Passive Intent and/or Plan: Without Intent; Without Plan; Without Means to Carry Out  Homicidal Thoughts:Homicidal Thoughts: No   Sensorium  Memory:Immediate Good; Recent Good; Remote Good  Judgment:Good  Insight:Good   Executive Functions  Concentration:Good  Attention Span:Good  Recall:Good  Fund of  Knowledge:Good  Language:Good   Psychomotor Activity  Psychomotor Activity:Psychomotor Activity: Normal   Assets  Assets:Communication Skills; Desire for Improvement; Financial Resources/Insurance; Housing; Physical Health; Leisure Time; Resilience; Social Support; Transportation   Sleep  Sleep:Sleep: Poor Number of Hours of Sleep: 2    Physical Exam: Physical Exam Vitals reviewed.  HENT:     Head: Normocephalic.     Right Ear: External ear normal.     Left Ear: External ear normal.  Eyes:     Conjunctiva/sclera: Conjunctivae normal.  Cardiovascular:     Rate and Rhythm: Normal rate.     Pulses: Normal pulses.  Pulmonary:     Effort: Pulmonary effort is normal.  Musculoskeletal:        General: Normal range of motion.     Cervical back: Normal range of motion.  Skin:    Coloration: Skin is not jaundiced or pale.  Neurological:     Mental Status: He is alert and oriented to person, place, and time.  Psychiatric:        Attention and Perception: Attention and perception normal.        Mood and Affect: Mood is depressed.        Speech: Speech normal.        Behavior: Behavior is cooperative.        Thought Content: Thought content includes suicidal ideation. Thought content does not include suicidal plan.  Cognition and Memory: Cognition normal.        Judgment: Judgment normal.    Review of Systems  Constitutional: Negative.   HENT: Negative.   Eyes: Negative.   Respiratory: Negative.   Cardiovascular: Negative.   Gastrointestinal: Negative.   Genitourinary: Negative.   Musculoskeletal: Negative.   Skin: Negative.   Neurological: Negative.   Endo/Heme/Allergies: Negative.   Psychiatric/Behavioral: Positive for depression.   Blood pressure (!) 129/96, pulse 77, temperature 98.4 F (36.9 C), temperature source Oral, resp. rate 20, SpO2 97 %. There is no height or weight on file to calculate BMI.  Musculoskeletal: Strength & Muscle Tone: within  normal limits Gait & Station: normal Patient leans: N/A   Recommendations:  Based on my evaluation the patient does not appear to have an emergency medical condition.   Patient was offered to be transported to the [facility] for observation but patient declined. Patient decline resources for out patient services. States he is connected in [place] at [facility] with Dr. Joen Laura in 2 days.   Patient appears to be seeking secondary gain.    Ardis Hughs, NP 01/01/2021, 1:07 PM

## 2021-01-01 NOTE — BH Assessment (Signed)
Comprehensive Clinical Assessment (CCA) Note  01/01/2021 [name] [id number] DISPOSITION: coleman NP recommends patient follow up with current provider for ongoing care. Patient was offered additional counseling resources although declined.    Chief Complaint:  Chief Complaint  Patient presents with  . Psychiatric Evaluation  The patient demonstrates the following risk factors for suicide: Chronic risk factors for suicide include: N/A. Acute risk factors for suicide include: N/A. Protective factors for this patient include: coping skills. Considering these factors, the overall suicide risk at this point appears to be low. Patient is appropriate for outpatient follow up.  Patient presents this date as a walk in at [facility] with passive S/I. Patient denies any plan or intent stating he "would never harm himself although sometimes thinks about going to sleep and not waking up." Patient denies any H/I or AVH. Patient reports a history of depression, GAD, panic disorder and OCD. Patient reports being followed by psychiatry at [facility] community services. Patient reports he is currently not involved in any OP counseling stating "none of that works." Patient denies any SA history. Patient was seen at [facility] on 09/12/20 when he presented with similar symptoms. Patient was observed and monitored overnight and discharged on 09/13/20. Patient also reports he was just discharged from [facility] where he was inpatient for one week and discharged within the last week. Patient is contracting for safety and is vague in reference to symptoms this date. Patient reports he is currently prescribed  Ativan, Zyprexa, Remeron with Cymbalta being added to his medication regimen. Patient reports current compliance although states he would like a inpatient admission this date to "just get right." Patient is vague in reference to identifying any current symptoms or how he would benefit from a  inpatient admission. Patient currently resides with his mother in [place], [place]. Patient was offered overnight observation to assist with passive S/I although patient declined. Patient is currently contacting for safety stating he "would never actually harm himself." Patient cannot identify any immediate stressors and there might be some secondary gain since patient was asking to be "checked in here for a while" [facility]. Patient was also offered multiple resources to include a PHP program IOP or assistance with locating a counselor although patient declined and is asking to be discharged.      Patient is oriented to person place and situation; he is engaged, alert and cooperative during assessment. Patient eye contact and tone of voice is normal, Patient's memory is intact and thoughts organized. Patient does not appear to be responding to internal stimuli.    Visit Diagnosis: MDD recurrent without psychotic features, severe, GAD    CCA Screening, Triage and Referral (STR)  Patient Reported Information How did you hear about [place]? Self  Referral name: Na (Phreesia 09/12/2020)  Referral phone number: No data recorded  Whom do you see for routine medical problems? I don't have a doctor  Practice/Facility Name: [facility] (Phreesia 09/12/2020)  Practice/Facility Phone Number: No data recorded Name of Contact: No data recorded Contact Number: No data recorded Contact Fax Number: No data recorded Prescriber Name: No data recorded Prescriber Address (if known): No data recorded  What Is the Reason for Your Visit/Call Today? Pt is requesting additional resources for ongoing mental health issues  How Long Has This Been Causing You Problems? > than 6 months  What Do You Feel Would Help You the Most Today? Social Support; Stress Management   Have You Recently Been in Any Inpatient  Treatment (Hospital/Detox/Crisis Center/28-Day Program)? No  Name/Location of Program/Hospital:No  data recorded How Long Were You There? No data recorded When Were You Discharged? No data recorded  [facility] You [facility]? Yes  [facility]? pt has been seen in the past when he presented with similar symptoms   Have You Recently Had Any Thoughts About Hurting Yourself? No  Are You Planning to Commit Suicide/Harm Yourself At This time? No   Have you Recently Had Thoughts About Hurting Someone Karolee Ohslse? No  Explanation: No data recorded  Have You Used Any Alcohol or Drugs in the Past 24 Hours? No  How Long Ago Did You Use Drugs or Alcohol? No data recorded What Did You Use and How Much? No data recorded  Do You Currently Have a Therapist/Psychiatrist? Yes  Name of [facility]rapist[facility]iatrist[facility]   Have You Been Recently Discharged From Any Public relations account executiveffice Practice or Programs? No  Explanation of Discharge From Practice/Program: No data recorded    CCA Screening Triage Referral Assessment Type of Contact: Face-to-Face  Is this Initial or Reassessment? No data recorded Date Telepsych consult ordered in [facility]:  No data recorded Time Telepsych consult ordered in [facility]:  No data recorded  Patient Reported Information Reviewed? Yes  Patient Left Without Being Seen? No data recorded Reason for Not Completing Assessment: No data recorded  Collateral Involvement: None at this time   Does Patient Have a Court Appointed Legal Guardian? No data recorded Name and Contact of Legal Guardian: No data recorded If Minor and Not Living with Parent(s), Who has Custody? NA  Is CPS involved or ever been involved? Never  Is APS involved or ever been involved? Never   Patient Determined To Be At Risk for Harm To Self or Others Based on Review of Patient Reported Information or Presenting Complaint? No  Method: No data recorded Availability of Means: No data recorded Intent: No data recorded Notification Required: No data  recorded Additional Information for Danger to Others Potential: No data recorded Additional Comments for Danger to Others Potential: No data recorded Are There Guns or Other Weapons in Your Home? No data recorded Types of Guns/Weapons: No data recorded Are These Weapons Safely Secured?                            No data recorded Who Could Verify You Are Able To Have These Secured: No data recorded Do You Have any Outstanding Charges, Pending Court Dates, Parole/Probation? No data recorded Contacted To Inform of Risk of Harm To Self or Others: Other: Comment (NA)   [facility]   Does Patient Present under Involuntary Commitment? No  IVC Papers Initial File Date: No data recorded  [place] [facility]   Patient Currently Receiving the Following Services: Medication Management   Determination of Need: Routine (7 days)   Options For Referral: Outpatient Therapy     CCA Biopsychosocial Intake/Chief Complaint:  depression and anxiety  Current Symptoms/Problems: depression and anxiety   Patient Reported Schizophrenia/Schizoaffective Diagnosis in Past: No   Strengths: UTA  Preferences: UTA  Abilities: UTA   Type of Services Patient Feels are Needed: No data recorded  Initial Clinical Notes/Concerns: No data recorded  Mental Health Symptoms Depression:  Difficulty Concentrating; Tearfulness; Sleep (too much or little); Weight gain/loss   Duration of Depressive symptoms: Greater than two weeks  Mania:  None   Anxiety:   Difficulty concentrating; Worrying; Tension   Psychosis:  None   Duration of Psychotic symptoms: No data recorded  Trauma:  N/A   Obsessions:  N/A   Compulsions:  N/A   Inattention:  N/A   Hyperactivity/Impulsivity:  N/A   Oppositional/Defiant Behaviors:  N/A   Emotional Irregularity:  N/A   Other Mood/Personality Symptoms:  No data recorded   Mental Status  Exam Appearance and self-care  Stature:  Average   Weight:  Overweight   Clothing:  Age-appropriate   Grooming:  Normal   Cosmetic use:  None   Posture/gait:  Normal   Motor activity:  Not Remarkable   Sensorium  Attention:  Normal   Concentration:  Normal   Orientation:  X5   Recall/memory:  Normal   Affect and Mood  Affect:  Congruent; Appropriate   Mood:  No data recorded  Relating  Eye contact:  Normal   Facial expression:  Responsive   Attitude toward examiner:  Cooperative   Thought and Language  Speech flow: Clear and Coherent   Thought content:  Appropriate to Mood and Circumstances   Preoccupation:  None   Hallucinations:  None   Organization:  No data recorded  [employer] of Knowledge:  Good   Intelligence:  Average   Abstraction:  Normal   Judgement:  Fair   Reality Testing:  Adequate   Insight:  Fair   Decision Making:  Normal   Social Functioning  Social Maturity:  Responsible   Social Judgement:  Normal   Stress  Stressors:  Illness   Coping Ability:  Human resources officer Deficits:  None   Supports:  Friends/Service system; Family     Religion:    Leisure/Recreation:    Exercise/Diet:     CCA Employment/Education Employment/Work Situation:    Education:     CCA Family/Childhood History Family and Relationship History:    Childhood History:     Child/Adolescent Assessment:     CCA Substance Use Alcohol/Drug Use:                           ASAM's:  Six Dimensions of Multidimensional Assessment  Dimension 1:  Acute Intoxication and/or Withdrawal Potential:      Dimension 2:  Biomedical Conditions and Complications:      Dimension 3:  Emotional, Behavioral, or Cognitive Conditions and Complications:     Dimension 4:  Readiness to Change:     Dimension 5:  Relapse, Continued use, or Continued Problem Potential:     Dimension 6:  Recovery/Living Environment:      ASAM Severity Score:    ASAM Recommended Level of Treatment:     Substance use Disorder (SUD)    Recommendations for Services/Supports/Treatments:    DSM5 Diagnoses: Patient Active Problem List   Diagnosis Date Noted  . MDD (major depressive disorder), recurrent severe, without psychosis (HCC) 09/12/2020  . GAD (generalized anxiety disorder) 09/12/2020  . Suicidal ideation 09/12/2020  . History of suicide attempt 09/12/2020  . ANXIETY 05/30/2009  . ESSENTIAL HYPERTENSION, BENIGN 05/30/2009  . PALPITATIONS, RECURRENT 05/30/2009    Patient Centered Plan: Patient is on the following Treatment Plan(s):     Referrals to Alternative Service(s): Referred to Alternative Service(s):   Place:   Date:   Time:    Referred to Alternative Service(s):   Place:   Date:   Time:    Referred to Alternative  Service(s):   Place:   Date:   Time:    Referred to Alternative Service(s):   Place:   Date:   Time:     Alfredia Ferguson, LCAS

## 2021-01-21 ENCOUNTER — Encounter: Payer: Self-pay | Admitting: Emergency Medicine

## 2021-01-21 ENCOUNTER — Emergency Department
Admission: EM | Admit: 2021-01-21 | Discharge: 2021-01-21 | Disposition: A | Discharge status: Home / Self Care | Payer: Medicare Other | Attending: Emergency Medicine | Admitting: Emergency Medicine

## 2021-01-21 ENCOUNTER — Other Ambulatory Visit: Payer: Self-pay

## 2021-01-21 DIAGNOSIS — F329 Major depressive disorder, single episode, unspecified: Secondary | ICD-10-CM | POA: Diagnosis present

## 2021-01-21 DIAGNOSIS — F17210 Nicotine dependence, cigarettes, uncomplicated: Secondary | ICD-10-CM | POA: Diagnosis not present

## 2021-01-21 DIAGNOSIS — Z79899 Other long term (current) drug therapy: Secondary | ICD-10-CM | POA: Diagnosis not present

## 2021-01-21 DIAGNOSIS — Z20822 Contact with and (suspected) exposure to covid-19: Secondary | ICD-10-CM | POA: Diagnosis not present

## 2021-01-21 DIAGNOSIS — F331 Major depressive disorder, recurrent, moderate: Secondary | ICD-10-CM | POA: Diagnosis not present

## 2021-01-21 DIAGNOSIS — F419 Anxiety disorder, unspecified: Secondary | ICD-10-CM | POA: Diagnosis not present

## 2021-01-21 DIAGNOSIS — I10 Essential (primary) hypertension: Secondary | ICD-10-CM | POA: Diagnosis not present

## 2021-01-21 LAB — COMPREHENSIVE METABOLIC PANEL
ALT: 29 U/L (ref 0–44)
AST: 19 U/L (ref 15–41)
Albumin: 4.3 g/dL (ref 3.5–5.0)
Alkaline Phosphatase: 66 U/L (ref 38–126)
Anion gap: 6 (ref 5–15)
BUN: 12 mg/dL (ref 6–20)
CO2: 29 mmol/L (ref 22–32)
Calcium: 9.5 mg/dL (ref 8.9–10.3)
Chloride: 104 mmol/L (ref 98–111)
Creatinine, Ser: 0.86 mg/dL (ref 0.61–1.24)
GFR, Estimated: >60 mL/min (ref >60)
Glucose, Bld: 124 mg/dL — ABNORMAL HIGH (ref 70–99)
Potassium: 4.2 mmol/L (ref 3.5–5.1)
Sodium: 139 mmol/L (ref 135–145)
Total Bilirubin: 0.9 mg/dL (ref 0.3–1.2)
Total Protein: 7.7 g/dL (ref 6.5–8.1)

## 2021-01-21 LAB — CBC
HCT: 47.4 % (ref 39.0–52.0)
Hemoglobin: 15.6 g/dL (ref 13.0–17.0)
MCH: 29.3 pg (ref 26.0–34.0)
MCHC: 32.9 g/dL (ref 30.0–36.0)
MCV: 88.9 fL (ref 80.0–100.0)
Platelets: 306 10*3/uL (ref 150–400)
RBC: 5.33 MIL/uL (ref 4.22–5.81)
RDW: 13.2 % (ref 11.5–15.5)
WBC: 10.7 10*3/uL — ABNORMAL HIGH (ref 4.0–10.5)
nRBC: 0 % (ref 0.0–0.2)

## 2021-01-21 LAB — RESP PANEL BY RT-PCR (FLU A&B, COVID) ARPGX2
Influenza A by PCR: NEGATIVE
Influenza B by PCR: NEGATIVE
SARS Coronavirus 2 by RT PCR: NEGATIVE

## 2021-01-21 LAB — ACETAMINOPHEN LEVEL: Acetaminophen (Tylenol), Serum: <10 ug/mL — ABNORMAL LOW (ref 10–30)

## 2021-01-21 LAB — SALICYLATE LEVEL: Salicylate Lvl: <7 mg/dL — ABNORMAL LOW (ref 7.0–30.0)

## 2021-01-21 LAB — ETHANOL: Alcohol, Ethyl (B): <10 mg/dL (ref <10)

## 2021-01-21 NOTE — ED Triage Notes (Signed)
Pt reports a lot of anxiety and depression for several days and thoughts of self harm, no plan

## 2021-01-21 NOTE — ED Notes (Signed)
Pt brought medications from home.  Labeled and given to pharmacy tech for med rec.

## 2021-01-21 NOTE — ED Provider Notes (Signed)
[facility] Emergency Department Provider Note  Time seen: 12:13 PM  I have reviewed the triage vital signs and the nursing notes.   HISTORY  Chief Complaint Depression and Anxiety   HPI [name] is a 45 y.o. male with a past medical history of depression, anxiety, presents to the emergency department for worsening depression anxiety and vague suicidal thoughts.  According to the patient for the past several weeks he has had progressively worsening depression and anxiety.  States vague intermittent suicidal thoughts but denies any plan to act.  Patient states he is taking his medications but believes they are not working well any longer and came to the emergency department voluntarily looking for help.  Patient denies any medical complaints besides heartburn/reflux which he gets on a daily basis per patient  History reviewed. No pertinent past medical history.  Patient Active Problem List   Diagnosis Date Noted   MDD (major depressive disorder), recurrent severe, without psychosis (HCC) 09/12/2020   GAD (generalized anxiety disorder) 09/12/2020   Suicidal ideation 09/12/2020   History of suicide attempt 09/12/2020   ANXIETY 05/30/2009   ESSENTIAL HYPERTENSION, BENIGN 05/30/2009   PALPITATIONS, RECURRENT 05/30/2009    History reviewed. No pertinent surgical history.  Prior to Admission medications   Medication Sig Start Date End Date Taking? Authorizing Provider  gabapentin (NEURONTIN) 400 MG capsule Take 400 mg by mouth 3 (three) times daily.    [provider]  LORazepam (ATIVAN) 1 MG tablet Take 1.5 mg by mouth 4 (four) times daily.    [provider]  metoprolol tartrate (LOPRESSOR) 25 MG tablet Take 25 mg by mouth 2 (two) times daily.    [provider]  OLANZapine (ZYPREXA) 5 MG tablet Take 5 mg by mouth at bedtime. Q hs for 14 days (started 09/11/20) 09/11/20 09/24/20  [provider]  ondansetron (ZOFRAN-ODT) 4  MG disintegrating tablet Take 1 tablet by mouth every 6 (six) hours as needed. nausea 06/21/20   [provider]  pantoprazole (PROTONIX) 40 MG tablet Take 40 mg by mouth in the morning. 09/11/20   [provider]  tiZANidine (ZANAFLEX) 2 MG tablet Take 1 tablet by mouth every 8 (eight) hours as needed. 08/28/17   [provider]    Allergies  Allergen Reactions   Compazine [Prochlorperazine]     gittery   Lamotrigine    Risperidone     No family history on file.  Social History Social History   Tobacco Use   Smoking status: Every Day    Packs/day: 0.50    Pack years: 0.00    Types: Cigarettes   Smokeless tobacco: Never  Substance Use Topics   Alcohol use: Not Currently   Drug use: Not Currently    Review of Systems Constitutional: Negative for fever. Cardiovascular: Negative for chest pain. Respiratory: Negative for shortness of breath. Gastrointestinal: Negative for abdominal pain.  States heartburn. Musculoskeletal: Negative for musculoskeletal complaints Neurological: Negative for headache All other ROS negative  ____________________________________________   PHYSICAL EXAM:  VITAL SIGNS: ED Triage Vitals  Enc Vitals Group     BP 01/21/21 1128 131/83     Pulse Rate 01/21/21 1128 85     Resp 01/21/21 1128 20     Temp 01/21/21 1132 98.7 F (37.1 C)     Temp Source 01/21/21 1132 Oral     SpO2 01/21/21 1128 97 %     Weight 01/21/21 1127 210 lb (95.3 kg)     Height 01/21/21  1127 5\' 9"  (1.753 m)     Head Circumference --      Peak Flow --      Pain Score 01/21/21 1127 0     Pain Loc --      Pain Edu? --      Excl. in GC? --    Constitutional: Alert and oriented. Well appearing and in no distress. Eyes: Normal exam ENT      Head: Normocephalic and atraumatic.      Mouth/Throat: Mucous membranes are moist. Cardiovascular: Normal rate, regular rhythm.  Respiratory: Normal respiratory effort without tachypnea nor retractions.  Breath sounds are clear Gastrointestinal: Soft and nontender. No distention.   Musculoskeletal: Nontender with normal range of motion in all extremities.  Neurologic:  Normal speech and language. No gross focal neurologic deficits Skin:  Skin is warm, dry and intact.  Psychiatric: Mood and affect are normal.    INITIAL IMPRESSION / ASSESSMENT AND PLAN / ED COURSE  Pertinent labs & imaging results that were available during my care of the patient were reviewed by me and considered in my medical decision making (see chart for details).   Patient states worsening depression and anxiety.  Patient is here voluntarily looking for help.  Lab work is pending at this time.  We will consult psychiatry and TTS to evaluate as well.  No plan to hurt himself do not believe he meets IVC criteria currently.  [name] was evaluated in Emergency Department on 01/21/2021 for the symptoms described in the history of present illness. He was evaluated in the context of the global COVID-19 pandemic, which necessitated consideration that the patient might be at risk for infection with the SARS-CoV-2 virus that causes COVID-19. Institutional protocols and algorithms that pertain to the evaluation of patients at risk for COVID-19 are in a state of rapid change based on information released by regulatory bodies including the CDC and federal and state organizations. These policies and algorithms were followed during the patient's care in the ED.  The patient has been placed in psychiatric observation due to the need to provide a safe environment for the patient while obtaining psychiatric consultation and evaluation, as well as ongoing medical and medication management to treat the patient's condition.  The patient has not been placed under full IVC at this time.  ____________________________________________   FINAL CLINICAL IMPRESSION(S) / ED DIAGNOSES  Depression Anxiety   03/23/2021, MD 01/21/21  1216

## 2021-01-21 NOTE — ED Notes (Signed)
Pt denies SI and  will be discharged home. Pt has called for a ride and is getting dressed.

## 2021-01-21 NOTE — Consult Note (Signed)
[facility] Psych ED Discharge  01/21/2021 3:27 PM [name]  MRN:  [id number] Principal Problem: Major depressive disorder, recurrent episode, moderate (HCC) Discharge Diagnoses: Principal Problem:   Major depressive disorder, recurrent episode, moderate (HCC)  Subjective:  This is 45 years old CM who presents to the ED with anxiety, depression and intermittent SI with no plan, or thoughts of acting on it. The patient stated that he has not experienced any auditory or visual hallucinations. The patient states that he does not sleep "too good" as sometimes he sleeps for 3 hours and wakes up or sometimes it is hard to go to sleep. The patient states that his appetite is good. Today he reports feeling anxious. It is also worthy per notes that the patient has had several ED visits in the last week with similar complaints but left AMA. He states the reason was because they did not have any beds.  On 01/19/2021 he was [facility], 01/17/2021 [place], 01/16/2021 he was at [facility], 01/15/2021 he was [facility] in [place]. The patient received ECT on 12/25/20, 12/27/2020 and 12/29/2020. [place] and [place] and patient was noted to have received large quantities of Ativan 120 tabs for 30 days, Gabapentin 120 for 30 days and Librium 120 for 15 days.He told the [facility] Specialist he wanted ECT even though he recently had this service.  He reports taking gabapentin, Lexapro, and Ativan.  He did not provide a UDS since February, last one was on 09/12/2020 and was positive for amphetamines and benzodiazepines.  He last filled a prescription for Ativan on 09/07/20.  The patient reported that he is close to his mom and his mom takes him to his hospital visits and lives with her. The patient reports he has a good support system which includes his mom, brother and friends. However, today he states he does not want provider to talk to his mother because : "I am a  grown man and I do not have altered mental status, I can answer any questions".    When he was told the psychiatric unit is full here, he requested to leave and follow up with his outpatient provider.  This is his typical response from other notes.  He denies suicidal ideations to this provider and RN, Amy Bosivert, and in regards to his depression, "If it gets worse, I will come back".  Total Time spent with patient: 1 hour  Past Psychiatric History: depression, anxiety, possible substance abuse  Past Medical History: History reviewed. No pertinent past medical history. History reviewed. No pertinent surgical history. Family History: No family history on file. Family Psychiatric  History: none Social History:  Social History   Substance and Sexual Activity  Alcohol Use Not Currently     Social History   Substance and Sexual Activity  Drug Use Not Currently    Social History   Socioeconomic History   Marital status: Single    Spouse name: Not on file   Number of children: Not on file   Years of education: Not on file   Highest education level: Not on file  Occupational History   Not on file  Tobacco Use   Smoking status: Every Day    Packs/day: 0.50    Pack years: 0.00    Types: Cigarettes   Smokeless tobacco: Never  Substance and Sexual Activity   Alcohol use: Not Currently   Drug use: Not Currently   Sexual activity: Not on  file  Other Topics Concern   Not on file  Social History Narrative   Not on file   Social Determinants of Health   Financial Resource Strain: Not on file  Food Insecurity: Not on file  Transportation Needs: Not on file  Physical Activity: Not on file  Stress: Not on file  Social Connections: Not on file    Tobacco Cessation:  A prescription for an FDA-approved tobacco cessation medication was offered at discharge and the patient refused  Current Medications: No current facility-administered medications for this encounter.   Current  Outpatient Medications  Medication Sig Dispense Refill   amLODipine (NORVASC) 5 MG tablet Take 5 mg by mouth daily.     escitalopram (LEXAPRO) 10 MG tablet Take 15 mg by mouth daily.     gabapentin (NEURONTIN) 300 MG capsule Take 600 mg by mouth 3 (three) times daily.     LORazepam (ATIVAN) 1 MG tablet Take 1.5 mg by mouth 4 (four) times daily.     metoprolol tartrate (LOPRESSOR) 25 MG tablet Take 25 mg by mouth 2 (two) times daily.     mirtazapine (REMERON SOL-TAB) 15 MG disintegrating tablet Take 7.5 mg by mouth at bedtime.     OLANZapine (ZYPREXA) 10 MG tablet Take 10 mg by mouth at bedtime.     OLANZapine (ZYPREXA) 2.5 MG tablet Take 2.5 mg by mouth daily.     ondansetron (ZOFRAN-ODT) 4 MG disintegrating tablet Take 1 tablet by mouth every 6 (six) hours as needed for nausea or vomiting.     pantoprazole (PROTONIX) 40 MG tablet Take 40 mg by mouth 2 (two) times daily.     tiZANidine (ZANAFLEX) 4 MG tablet Take 4 mg by mouth 3 (three) times daily.     PTA Medications: (Not in a hospital admission)   Musculoskeletal: Strength & Muscle Tone: within normal limits Gait & Station: normal Patient leans: N/A  Psychiatric Specialty Exam:  Presentation  General Appearance: Fairly Groomed  Eye Contact:Good  Speech:Clear and Coherent; Normal Rate  Speech Volume:Normal  Handedness:Right   Mood and Affect  Mood:Depressed; Irritable  Affect:Congruent   Thought Process  Thought Processes:Coherent  Descriptions of Associations:Intact  Orientation:Full (Time, Place and Person)  Thought Content:Logical  History of Schizophrenia/Schizoaffective disorder:No  Duration of Psychotic Symptoms: None  Hallucinations: None  Ideas of Reference:None  Suicidal Thoughts:  None Homicidal Thoughts:  None  Sensorium  Memory:Immediate Good; Recent Good; Remote Good  Judgment:Good  Insight:Good   Executive Functions  Concentration:Good  Attention  Span:Good  Recall:Good  Fund of Knowledge:Good  Language:Good   Psychomotor Activity  Psychomotor Activity: No data recorded  Assets  Assets:Communication Skills; Desire for Improvement; Financial Resources/Insurance; Housing; Physical Health; Leisure Time; Resilience; Social Support; Transportation   Sleep  Sleep: Fair  Physical Exam: Physical Exam Vitals and nursing note reviewed.  HENT:     Head: Normocephalic.     Nose: Nose normal.  Pulmonary:     Effort: Pulmonary effort is normal.  Musculoskeletal:        General: Normal range of motion.     Cervical back: Normal range of motion.  Neurological:     General: No focal deficit present.     Mental Status: He is alert and oriented to person, place, and time.  Psychiatric:        Attention and Perception: Attention and perception normal.        Mood and Affect: Mood is anxious and depressed.        Speech:  Speech normal.        Behavior: Behavior normal. Behavior is cooperative.        Thought Content: Thought content normal.        Cognition and Memory: Cognition and memory normal.        Judgment: Judgment normal.   Review of Systems  Psychiatric/Behavioral:  Positive for depression. The patient is nervous/anxious.   All other systems reviewed and are negative. Blood pressure 131/83, pulse 85, temperature 98.7 F (37.1 C), temperature source Oral, resp. rate 20, height 5\' 9"  (1.753 m), weight 95.3 kg, SpO2 97 %. Body mass index is 31.01 kg/m.   Demographic Factors:  Male and Caucasian  Loss Factors: NA  Historical Factors: NA  Risk Reduction Factors:   Sense of responsibility to family, Living with another person, especially a relative, Positive social support, and Positive therapeutic relationship  Continued Clinical Symptoms:  Anxiety, mild; depression, moderate  Cognitive Features That Contribute To Risk:  None    Suicide Risk:  Minimal: No identifiable suicidal ideation.  Patients  presenting with no risk factors but with morbid ruminations; may be classified as minimal risk based on the severity of the depressive symptoms   Plan Of Care/Follow-up recommendations:  Major depressive disorder, recurrent, moderate: -Continue Lexapro 15 mg daily -Continue gabapentin 600 mg TID from outpatient provider  Activity:  as tolerated Diet:  heart healthy diet  Disposition: discharge home , NP 01/21/2021, 3:27 PM

## 2021-01-21 NOTE — ED Notes (Signed)
Pt discharged home.  All belongings returned to pt (including wallet, cell phone and medications).  Pt denies SI.  Refused VS and discharge papers.

## 2021-01-21 NOTE — BH Assessment (Addendum)
Comprehensive Clinical Assessment (CCA) Screening, Triage and Referral Note  01/21/2021 [name] [id number]  Chief Complaint:  Chief Complaint  Patient presents with   Depression   Anxiety   Visit Diagnosis: Pt presented to ED; pt resides with his mother in [place]; pt has been working with [employer] for several years and he feels as though his medication is no longer working as he struggles to eat and sleep; pt feels he needs a ECT to better understand his problems; pt has suicidal ideations and thinks about harming self; pt is not A/V hallucinating; pt has been talking to his doctor about his current problems and they mentioned [facility]; pt has support with his mother and is currently on disability; pt feels lonely and sad;   Patient Reported Information How did you hear about [place]? Self  What Is the Reason for Your Visit/Call Today? Pt is requesting additional resources for ongoing mental health issues  How Long Has This Been Causing You Problems? > than 6 months  What Do You Feel Would Help You the Most Today? Treatment for Depression or other mood problem   Have You Recently Had Any Thoughts About Hurting Yourself? Yes  Are You Planning to Commit Suicide/Harm Yourself At This time? Yes   Have you Recently Had Thoughts About Hurting Someone Karolee Ohs? No  Are You Planning to Harm Someone at This Time? No  Explanation: No data recorded  Have You Used Any Alcohol or Drugs in the Past 24 Hours? No  How Long Ago Did You Use Drugs or Alcohol? No data recorded What Did You Use and How Much? No data recorded  Do You Currently Have a Therapist/Psychiatrist? Yes  Name of Therapist/Psychiatrist: [facility]   Have You Been Recently Discharged From Any Office Practice or Programs? No  Explanation of Discharge From Practice/Program: No data recorded   CCA Screening Triage Referral Assessment Type of Contact:  Face-to-Face  Telemedicine Service Delivery:   Is this Initial or Reassessment? No data recorded Date Telepsych consult ordered in [facility]:  No data recorded Time Telepsych consult ordered in [facility]:  No data recorded Location of Assessment: [facility] ED  Provider Location: No data recorded  Collateral Involvement: None at this time   Does Patient Have a Court Appointed Legal Guardian? No data recorded Name and Contact of Legal Guardian: No data recorded If Minor and Not Living with Parent(s), Who has Custody? NA  Is CPS involved or ever been involved? Never  Is APS involved or ever been involved? Never   Patient Determined To Be At Risk for Harm To Self or Others Based on Review of Patient Reported Information or Presenting Complaint? Yes, for Self-Harm (pt wants to hang or stab self)  Method: No data recorded Availability of Means: No data recorded Intent: No data recorded Notification Required: No data recorded Additional Information for Danger to Others Potential: No data recorded Additional Comments for Danger to Others Potential: No data recorded Are There Guns or Other Weapons in Your Home? No data recorded Types of Guns/Weapons: No data recorded Are These Weapons Safely Secured?                            No data recorded Who Could Verify You Are Able To Have These Secured: No data recorded Do You Have any Outstanding Charges, Pending Court Dates, Parole/Probation? No data recorded Contacted To Inform of Risk of Harm To Self or Others:  Other: Comment (NA)   Does Patient Present under Involuntary Commitment? No  IVC Papers Initial File Date: No data recorded  [place] of Residence: Other (Comment) [place])   Patient Currently Receiving the Following Services: Individual Therapy   Determination of Need: Urgent (48 hours)   Options For Referral: Inpatient Hospitalization   Discharge Disposition: Pt will be discharged and will be contacing his mental health clinic  tomorrow; pt does not want to wait in the ED; pt stated he is stable enough to speak with his clinic for support; Pt's mom is supportive and has been transporting him to various ED facilities     Aairah Negrette, [employer], Counselor

## 2021-02-22 ENCOUNTER — Emergency Department (HOSPITAL_COMMUNITY): Payer: Medicare Other

## 2021-02-22 ENCOUNTER — Emergency Department (HOSPITAL_COMMUNITY)
Admission: EM | Admit: 2021-02-22 | Discharge: 2021-02-22 | Disposition: A | Discharge status: Home / Self Care | Payer: Medicare Other | Attending: Emergency Medicine | Admitting: Emergency Medicine

## 2021-02-22 ENCOUNTER — Encounter (HOSPITAL_COMMUNITY): Payer: Self-pay | Admitting: Emergency Medicine

## 2021-02-22 ENCOUNTER — Other Ambulatory Visit: Payer: Self-pay

## 2021-02-22 DIAGNOSIS — R1013 Epigastric pain: Secondary | ICD-10-CM | POA: Insufficient documentation

## 2021-02-22 DIAGNOSIS — F17210 Nicotine dependence, cigarettes, uncomplicated: Secondary | ICD-10-CM | POA: Diagnosis not present

## 2021-02-22 DIAGNOSIS — R112 Nausea with vomiting, unspecified: Secondary | ICD-10-CM | POA: Insufficient documentation

## 2021-02-22 DIAGNOSIS — R1010 Upper abdominal pain, unspecified: Secondary | ICD-10-CM

## 2021-02-22 DIAGNOSIS — Z79899 Other long term (current) drug therapy: Secondary | ICD-10-CM | POA: Insufficient documentation

## 2021-02-22 DIAGNOSIS — R197 Diarrhea, unspecified: Secondary | ICD-10-CM | POA: Insufficient documentation

## 2021-02-22 DIAGNOSIS — I10 Essential (primary) hypertension: Secondary | ICD-10-CM | POA: Insufficient documentation

## 2021-02-22 LAB — URINALYSIS, ROUTINE W REFLEX MICROSCOPIC
Bilirubin Urine: NEGATIVE
Glucose, UA: NEGATIVE mg/dL
Hgb urine dipstick: NEGATIVE
Ketones, ur: NEGATIVE mg/dL
Leukocytes,Ua: NEGATIVE
Nitrite: NEGATIVE
Protein, ur: NEGATIVE mg/dL
Specific Gravity, Urine: 1.01 (ref 1.005–1.030)
pH: 6 (ref 5.0–8.0)

## 2021-02-22 LAB — COMPREHENSIVE METABOLIC PANEL
ALT: 25 U/L (ref 0–44)
AST: 19 U/L (ref 15–41)
Albumin: 4.1 g/dL (ref 3.5–5.0)
Alkaline Phosphatase: 60 U/L (ref 38–126)
Anion gap: 7 (ref 5–15)
BUN: 10 mg/dL (ref 6–20)
CO2: 28 mmol/L (ref 22–32)
Calcium: 9.4 mg/dL (ref 8.9–10.3)
Chloride: 105 mmol/L (ref 98–111)
Creatinine, Ser: 1.05 mg/dL (ref 0.61–1.24)
GFR, Estimated: >60 mL/min (ref >60)
Glucose, Bld: 116 mg/dL — ABNORMAL HIGH (ref 70–99)
Potassium: 4.1 mmol/L (ref 3.5–5.1)
Sodium: 140 mmol/L (ref 135–145)
Total Bilirubin: 0.6 mg/dL (ref 0.3–1.2)
Total Protein: 6.9 g/dL (ref 6.5–8.1)

## 2021-02-22 LAB — CBC
HCT: 47.9 % (ref 39.0–52.0)
Hemoglobin: 15.3 g/dL (ref 13.0–17.0)
MCH: 28.9 pg (ref 26.0–34.0)
MCHC: 31.9 g/dL (ref 30.0–36.0)
MCV: 90.5 fL (ref 80.0–100.0)
Platelets: 300 10*3/uL (ref 150–400)
RBC: 5.29 MIL/uL (ref 4.22–5.81)
RDW: 12.6 % (ref 11.5–15.5)
WBC: 10.2 10*3/uL (ref 4.0–10.5)
nRBC: 0 % (ref 0.0–0.2)

## 2021-02-22 LAB — LIPASE, BLOOD: Lipase: 33 U/L (ref 11–51)

## 2021-02-22 MED ORDER — ALUM & MAG HYDROXIDE-SIMETH 400-400-40 MG/5ML PO SUSP: 10.0000 mL | Freq: Four times a day (QID) | ORAL | 0 refills | Status: AC | PRN

## 2021-02-22 MED ORDER — ALUM & MAG HYDROXIDE-SIMETH 200-200-20 MG/5ML PO SUSP
30.0000 mL | Freq: Once | ORAL | Status: AC
  Administered 2021-02-22: 30 mL via ORAL
  Filled 2021-02-22: qty 30

## 2021-02-22 MED ORDER — LIDOCAINE VISCOUS HCL 2 % MT SOLN
15.0000 mL | Freq: Once | OROMUCOSAL | Status: AC
  Administered 2021-02-22: 15 mL via ORAL
  Filled 2021-02-22: qty 15

## 2021-02-22 NOTE — Discharge Instructions (Addendum)
Continue taking your home medications as prescribed.  Take zofran as needed for nausea and vomiting.  Use maalox as needed for pain.  Take tylenol as needed for pain.  Follow up with your primary care doctor for recheck of symptoms.  Follow up with the GI doctor as needed for further evaluation of your stomach problems.  Return to the ER if you develop high fevers, persistent vomiting, blood in your emesis, or any new, worsening, or concerning symptoms.

## 2021-02-22 NOTE — ED Notes (Signed)
Patient transported to CT 

## 2021-02-22 NOTE — ED Triage Notes (Addendum)
Patient complains of abdominal pain and dry heaves that started two days ago. 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 Patient states this pain feels similar to prior to the repair. Patient alert, oriented, and in no apparent distress at this time.

## 2021-02-22 NOTE — ED Provider Notes (Signed)
[facility] EMERGENCY DEPARTMENT Provider Note   CSN: [id number] Arrival date & time: 02/22/21  1013     History Chief Complaint  Patient presents with   Abdominal Pain    [name] is a 45 y.o. male presenting for evaluation nausea, vomiting, abdominal pain.  Patient states that the past 2 days he has had persistent epigastric abdominal pain and nausea/vomiting with dry heaves.  There is no blood in his emesis.  Symptoms are worse after eating or drinking.  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  This was repaired at [facility] on 10/03/2020. Pt states he has been taking zofran without improvement of symptoms.  He has not taken anything else for symptoms.  He has associated mild diarrhea.  No fevers, chills, chest pain, shortness of breath, urinary symptoms.  HPI     History reviewed. No pertinent past medical history.  Patient Active Problem List   Diagnosis Date Noted   Major depressive disorder, recurrent episode, moderate (HCC) 01/21/2021   GAD (generalized anxiety disorder) 09/12/2020   Suicidal ideation 09/12/2020   History of suicide attempt 09/12/2020   ANXIETY 05/30/2009   ESSENTIAL HYPERTENSION, BENIGN 05/30/2009   PALPITATIONS, RECURRENT 05/30/2009    No past surgical history on file.     No family history on file.  Social History   Tobacco Use   Smoking status: Every Day    Packs/day: 0.50    Types: Cigarettes   Smokeless tobacco: Never  Substance Use Topics   Alcohol use: Not Currently   Drug use: Not Currently    Home Medications Prior to Admission medications   Medication Sig Start Date End Date Taking? Authorizing Provider  amLODipine (NORVASC) 5 MG tablet Take 5 mg by mouth daily.    [provider]  escitalopram (LEXAPRO) 10 MG tablet Take 15 mg by mouth daily.    [provider]  gabapentin (NEURONTIN) 300  MG capsule Take 600 mg by mouth 3 (three) times daily.    [provider]  metoprolol tartrate (LOPRESSOR) 25 MG tablet Take 25 mg by mouth 2 (two) times daily.    [provider]  mirtazapine (REMERON SOL-TAB) 15 MG disintegrating tablet Take 7.5 mg by mouth at bedtime.    [provider]  ondansetron (ZOFRAN-ODT) 4 MG disintegrating tablet Take 1 tablet by mouth every 6 (six) hours as needed for nausea or vomiting.    [provider]  pantoprazole (PROTONIX) 40 MG tablet Take 40 mg by mouth 2 (two) times daily.    [provider]  OLANZapine (ZYPREXA) 10 MG tablet Take 10 mg by mouth at bedtime.  01/21/21  [provider]    Allergies    Compazine [prochlorperazine], Lamotrigine, and Risperidone  Review of Systems   Review of Systems  Gastrointestinal:  Positive for abdominal pain, diarrhea, nausea and vomiting.  All other systems reviewed and are negative.  Physical Exam Updated Vital Signs BP 112/75   Pulse 70   Temp 98.9 F (37.2 C) (Oral)   Resp 12   Ht 5\' 9"  (1.753 m)   Wt 90.7 kg   SpO2 97%   BMI 29.53 kg/m   Physical Exam Vitals and nursing note reviewed.  Constitutional:      General: He is not in acute distress.    Appearance: Normal appearance.     Comments: Appears nontoxic  HENT:  Head: Normocephalic and atraumatic.  Eyes:     Conjunctiva/sclera: Conjunctivae normal.     Pupils: Pupils are equal, round, and reactive to light.  Cardiovascular:     Rate and Rhythm: Normal rate and regular rhythm.     Pulses: Normal pulses.  Pulmonary:     Effort: Pulmonary effort is normal. No respiratory distress.     Breath sounds: Normal breath sounds. No wheezing.     Comments: Speaking in full sentences.  Clear lung sounds in all fields. Abdominal:     General: There is no distension.     Palpations: Abdomen is soft. There is no mass.     Tenderness: There is abdominal tenderness. There is no guarding or  rebound.     Comments: TTP of upper abd. No rigidity or distention. Negative rebound. No vomiting noted on exam  Musculoskeletal:        General: Normal range of motion.     Cervical back: Normal range of motion and neck supple.  Skin:    General: Skin is warm and dry.     Capillary Refill: Capillary refill takes less than 2 seconds.  Neurological:     Mental Status: He is alert and oriented to person, place, and time.  Psychiatric:        Mood and Affect: Mood and affect normal.        Speech: Speech normal.        Behavior: Behavior normal.    ED Results / Procedures / Treatments   Labs (all labs ordered are listed, but only abnormal results are displayed) Labs Reviewed  COMPREHENSIVE METABOLIC PANEL - Abnormal; Notable for the following components:      Result Value   Glucose, Bld 116 (*)    All other components within normal limits  LIPASE, BLOOD  CBC  URINALYSIS, ROUTINE W REFLEX MICROSCOPIC    EKG None  Radiology CT ABDOMEN PELVIS WO CONTRAST  Result Date: 02/22/2021 CLINICAL DATA:  Epigastric pain, nausea, and vomiting EXAM: CT ABDOMEN AND PELVIS WITHOUT CONTRAST TECHNIQUE: Multidetector CT imaging of the abdomen and pelvis was performed following the standard protocol without IV contrast. Sagittal and coronal MPR images reconstructed from axial data set. No oral contrast administered. COMPARISON:  None FINDINGS: Lower chest: Lung bases clear Hepatobiliary: Gallbladder and liver normal appearance Pancreas: Normal appearance Spleen: Normal appearance Adrenals/Urinary Tract: Adrenal glands normal appearance. BILATERAL renal cysts up to 3.2 cm diameter LEFT and 5.1 cm diameter RIGHT. No urinary tract calcification or dilatation. Ureters and bladder unremarkable for degree of bladder distension. Stomach/Bowel: Elevated LEFT hemidiaphragm. Normal stomach. Adjacent calcifications versus postsurgical change at LEFT diaphragm anteromedially. Normal appendix. Bowel loops  unremarkable. Vascular/Lymphatic: Atherosclerotic calcifications aorta without aneurysm. No adenopathy. Reproductive: Unremarkable prostate gland and seminal vesicles Other: No free air or free fluid.  No inflammatory process. Musculoskeletal: Unremarkable IMPRESSION: BILATERAL renal cysts. Elevated LEFT hemidiaphragm with question prior surgical repair versus calcification. No acute intra-abdominal or intrapelvic abnormalities. Aortic Atherosclerosis (ICD10-I70.0). Electronically Signed   By: Ulyses Southward M.D.   On: 02/22/2021 13:09    Procedures Procedures   Medications Ordered in ED Medications  alum & mag hydroxide-simeth (MAALOX/MYLANTA) 200-200-20 MG/5ML suspension 30 mL (30 mLs Oral Given 02/22/21 1155)    And  lidocaine (XYLOCAINE) 2 % viscous mouth solution 15 mL (15 mLs Oral Given 02/22/21 1155)    ED Course  I have reviewed the triage vital signs and the nursing notes.  Pertinent labs & imaging results that were available  during my care of the patient were reviewed by me and considered in my medical decision making (see chart for details).    MDM Rules/Calculators/A&P                          Patient presenting for evaluation of nausea, vomit, abdominal pain.  On exam, patient appears nontoxic.  No vomiting noted on my exam.  He has already been taking Zofran at home, I offered other antiemetics and patient declined.  Patient is requesting a GI cocktail.  Labs obtained from triage interpreted by me, overall reassuring.  Kidney, liver, pancreatic function normal.  No leukocytosis.  Urine without signs of infection.  Considering patient's history, will obtain CT scan.  Patient is requesting without contrast due to effects of the dye.  CT negative for acute findings.  Shows postsurgical changes and chronically elevated left hemidiaphragm.  On reevaluation, patient report significant improvement of symptoms after GI cocktail.  No further vomiting.  I discussed continued symptomatic  management and follow-up with PCP.  GI information given as needed for follow-up.  At this time, patient appears safe for discharge.  Return precautions given.  Patient states he understands and agrees to plan.   Final Clinical Impression(s) / ED Diagnoses Final diagnoses:  None    Rx / DC Orders ED Discharge Orders     None        Alveria Apley, PA-C 02/22/21 1447    Eber Hong, MD 02/23/21 [phone]

## 2021-09-25 IMAGING — CT CT ABD-PELV W/O CM
3 of 6 series · 17 of 46 positions shown, 19 images · non-contrast
Comparison: None

CLINICAL DATA: Epigastric pain, nausea, and vomiting

EXAM:
CT ABDOMEN AND PELVIS WITHOUT CONTRAST
TECHNIQUE: Multidetector CT imaging of the abdomen and pelvis was performed
following the standard protocol without IV contrast. Sagittal and
coronal MPR images reconstructed from axial data set. No oral
contrast administered.

[Series 3: a/p w/o 5mm · axial · non-contrast · 0.90mm/px · z∈[+723,+1163]mm · 11 of 106 slices shown, 13 images]
[im 9/106  soft-tissue]
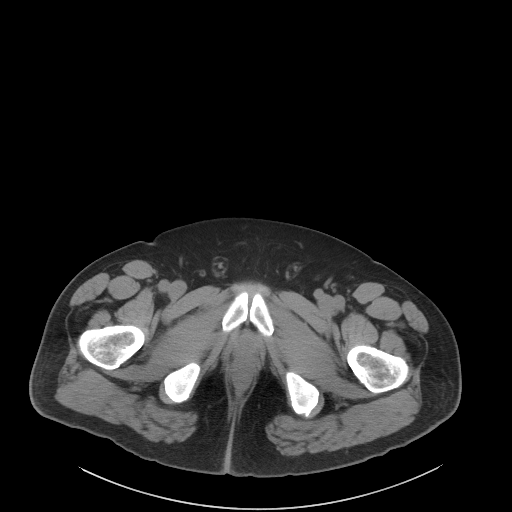
[im 9/106  bone]
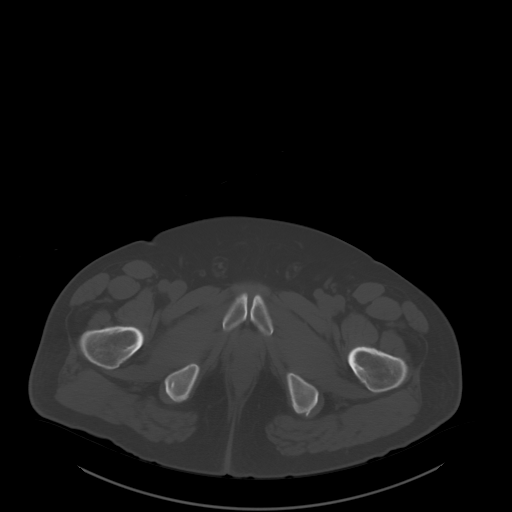
[im 18/106  soft-tissue]
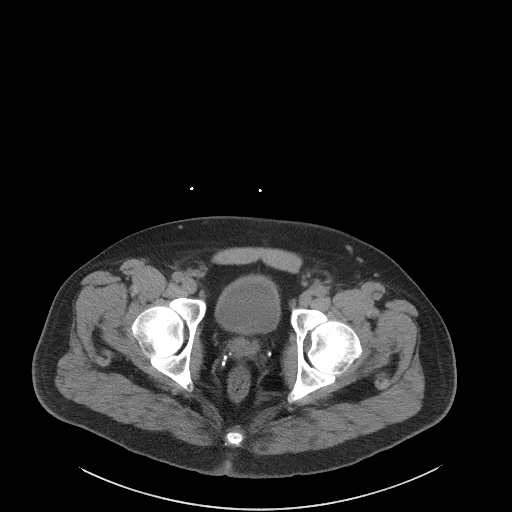
[im 27/106  soft-tissue]
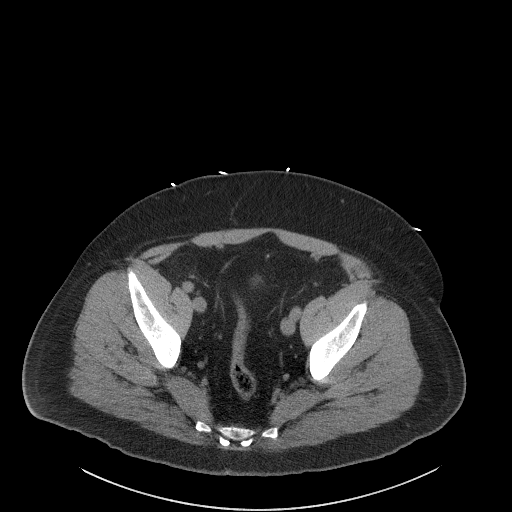
[im 36/106  soft-tissue]
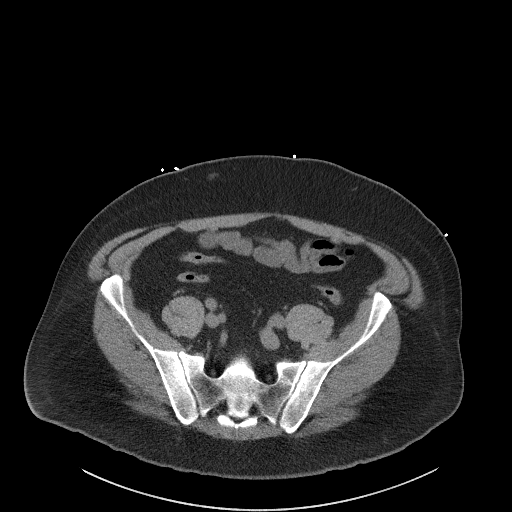
[im 44/106  soft-tissue]
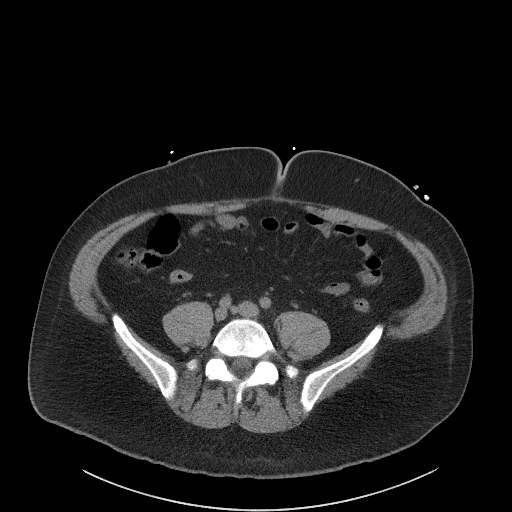
[im 53/106  soft-tissue]
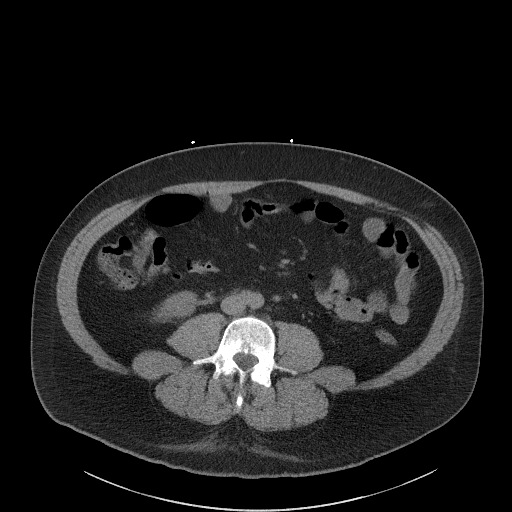
[im 62/106  soft-tissue]
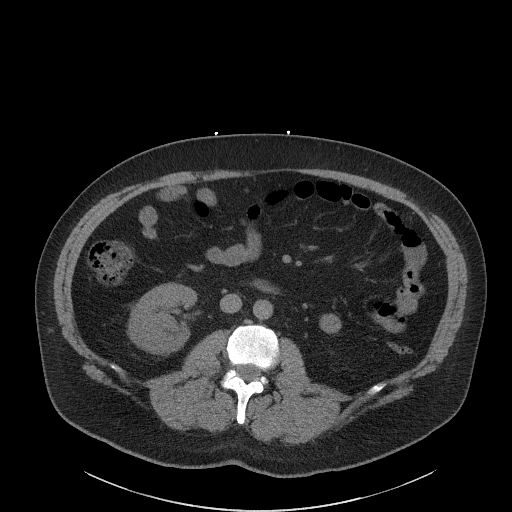
[im 71/106  soft-tissue]
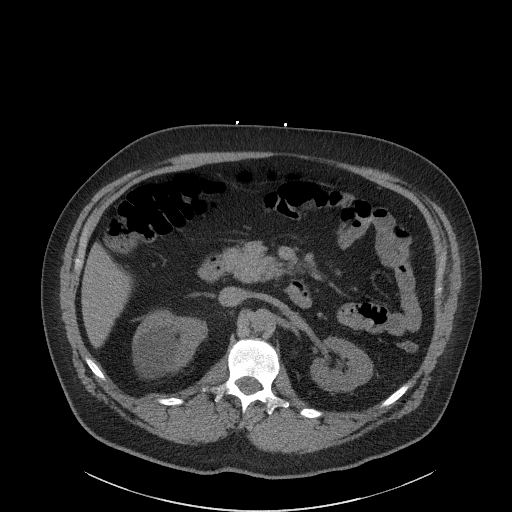
[im 79/106  soft-tissue]
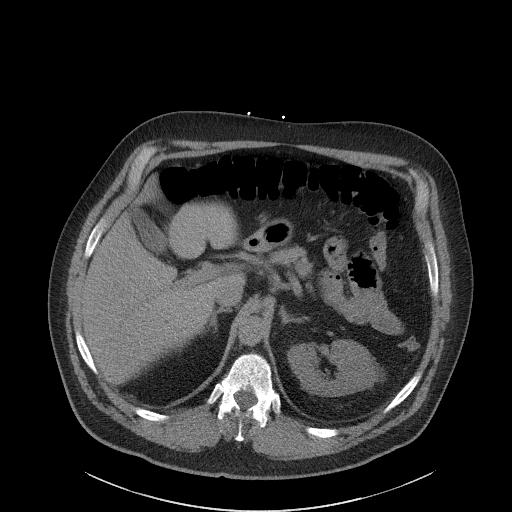
[im 79/106  bone]
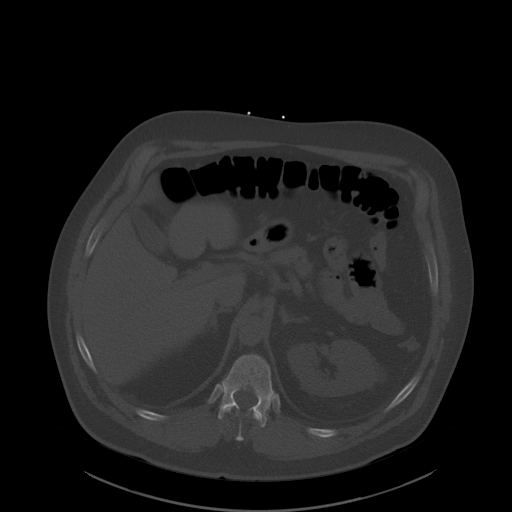
[im 88/106  soft-tissue]
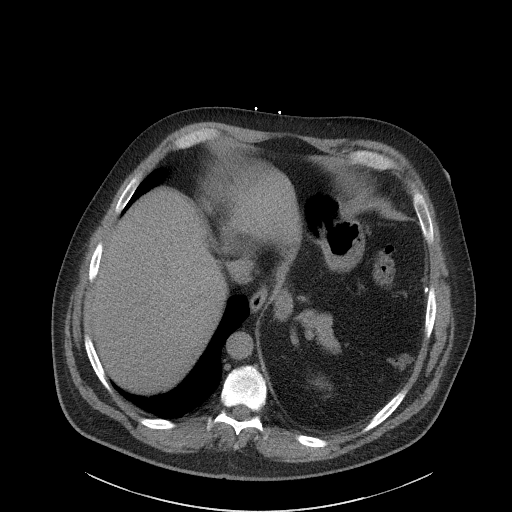
[im 97/106  soft-tissue]
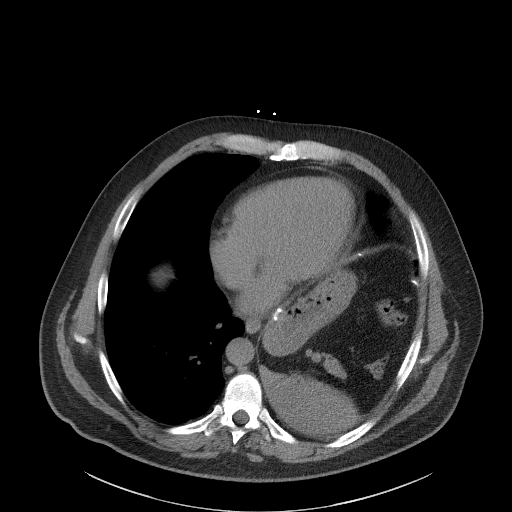

[Series 4: lung · axial · 0.90mm/px · z∈[+723,+858]mm · 3 of 106 slices shown]
[im 9/106  bone]
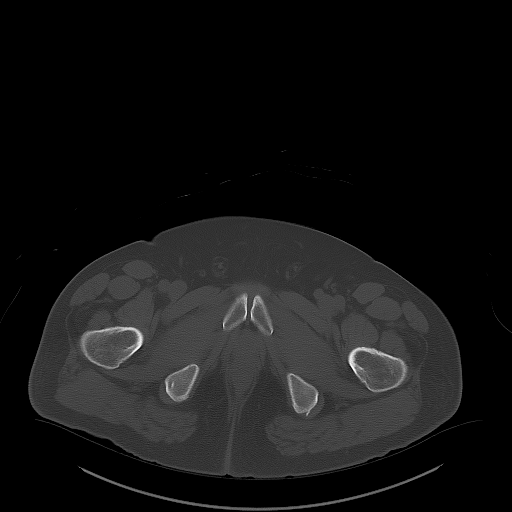
[im 27/106  bone]
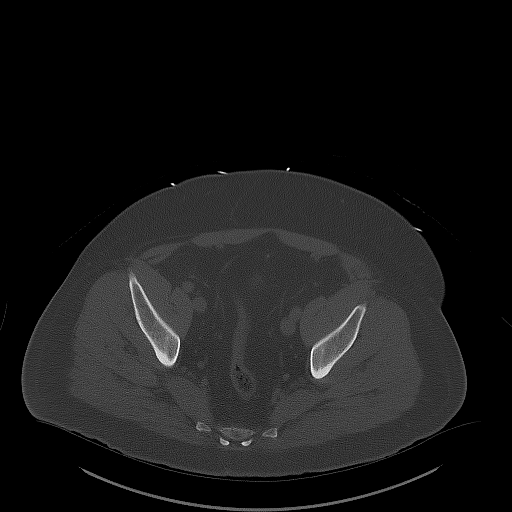
[im 36/106  bone]
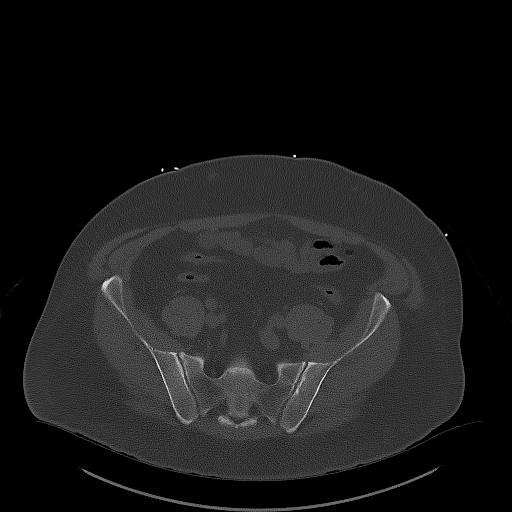

[Series 6: a/p w/o cor · coronal · non-contrast · 0.99mm/px · 3 of 159 slices shown]
[im 53/159  soft-tissue]
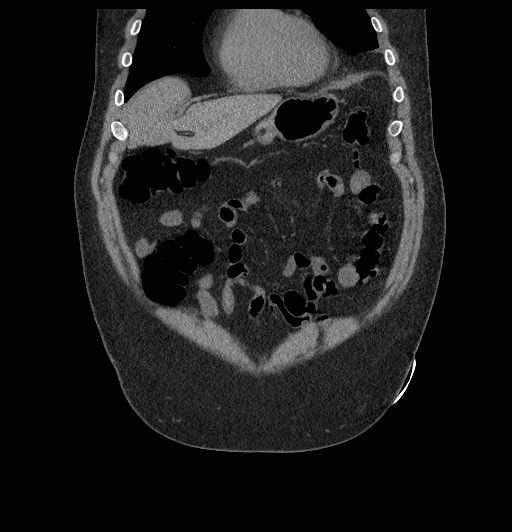
[im 71/159  soft-tissue]
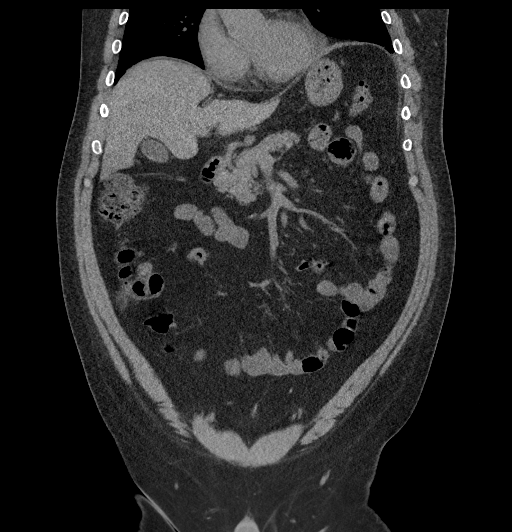
[im 88/159  soft-tissue]
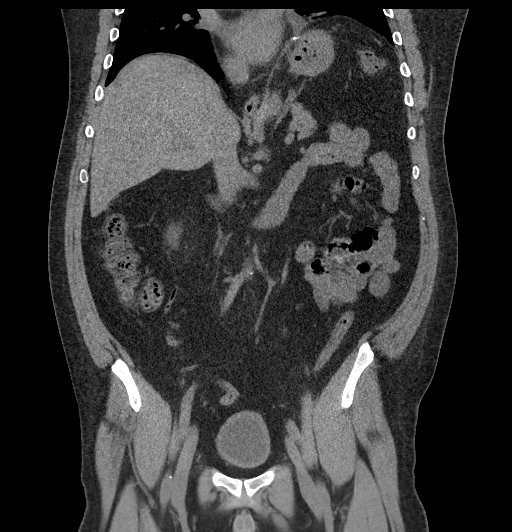

[17 of 46 positions shown; findings below may reference images not displayed]

FINDINGS: Lower chest: Lung bases clear

Hepatobiliary: Gallbladder and liver normal appearance

Pancreas: Normal appearance

Spleen: Normal appearance

Adrenals/Urinary Tract: Adrenal glands normal appearance. BILATERAL
renal cysts up to 3.2 cm diameter LEFT and 5.1 cm diameter RIGHT. No
urinary tract calcification or dilatation. Ureters and bladder
unremarkable for degree of bladder distension.

Stomach/Bowel: Elevated LEFT hemidiaphragm. Normal stomach. Adjacent
calcifications versus postsurgical change at LEFT diaphragm
anteromedially. Normal appendix. Bowel loops unremarkable.

Vascular/Lymphatic: Atherosclerotic calcifications aorta without
aneurysm. No adenopathy.

Reproductive: Unremarkable prostate gland and seminal vesicles

Other: No free air or free fluid.  No inflammatory process.

Musculoskeletal: Unremarkable
IMPRESSION: BILATERAL renal cysts.

Elevated LEFT hemidiaphragm with question prior surgical repair
versus calcification.

No acute intra-abdominal or intrapelvic abnormalities.

Aortic Atherosclerosis ([id number]).

## 2021-11-07 ENCOUNTER — Encounter (HOSPITAL_COMMUNITY): Payer: Self-pay | Admitting: *Deleted

## 2021-11-07 ENCOUNTER — Other Ambulatory Visit: Payer: Self-pay

## 2021-11-07 ENCOUNTER — Emergency Department (HOSPITAL_COMMUNITY)
Admission: EM | Admit: 2021-11-07 | Discharge: 2021-11-07 | Disposition: A | Discharge status: Home / Self Care | Payer: Medicare Other | Attending: Emergency Medicine | Admitting: Emergency Medicine

## 2021-11-07 DIAGNOSIS — K029 Dental caries, unspecified: Secondary | ICD-10-CM | POA: Diagnosis not present

## 2021-11-07 DIAGNOSIS — K0889 Other specified disorders of teeth and supporting structures: Secondary | ICD-10-CM

## 2021-11-07 DIAGNOSIS — Z79899 Other long term (current) drug therapy: Secondary | ICD-10-CM | POA: Diagnosis not present

## 2021-11-07 DIAGNOSIS — I10 Essential (primary) hypertension: Secondary | ICD-10-CM | POA: Diagnosis not present

## 2021-11-07 DIAGNOSIS — Z87891 Personal history of nicotine dependence: Secondary | ICD-10-CM | POA: Diagnosis not present

## 2021-11-07 MED ORDER — DOXYCYCLINE HYCLATE 100 MG PO CAPS: 100.0000 mg | ORAL_CAPSULE | Freq: Two times a day (BID) | ORAL | 0 refills | Status: AC

## 2021-11-07 MED ORDER — HYDROCODONE-ACETAMINOPHEN 5-325 MG PO TABS
1.0000 | ORAL_TABLET | Freq: Once | ORAL | Status: AC
  Administered 2021-11-07: 1 via ORAL
  Filled 2021-11-07: qty 1

## 2021-11-07 MED ORDER — LIDOCAINE VISCOUS HCL 2 % MT SOLN
15.0000 mL | Freq: Once | OROMUCOSAL | Status: AC
Start: 2021-11-07 — End: 2021-11-07
  Administered 2021-11-07: 15 mL via OROMUCOSAL
  Filled 2021-11-07: qty 15

## 2021-11-07 MED ORDER — KETOROLAC TROMETHAMINE 60 MG/2ML IM SOLN
60.0000 mg | Freq: Once | INTRAMUSCULAR | Status: AC
  Administered 2021-11-07: 60 mg via INTRAMUSCULAR
  Filled 2021-11-07: qty 2

## 2021-11-07 MED ORDER — OXYCODONE HCL 5 MG PO TABS: 5.0000 mg | ORAL_TABLET | ORAL | 0 refills | Status: AC | PRN

## 2021-11-07 MED ORDER — ACETAMINOPHEN 325 MG PO TABS: 650.0000 mg | ORAL_TABLET | Freq: Four times a day (QID) | ORAL | 0 refills | Status: AC | PRN

## 2021-11-07 MED ORDER — LIDOCAINE VISCOUS HCL 2 % MT SOLN: 15.0000 mL | OROMUCOSAL | 0 refills | Status: AC | PRN

## 2021-11-07 NOTE — ED Provider Triage Note (Signed)
Emergency Medicine Provider Triage Evaluation Note ? ?[name] , a 46 y.o. male  was evaluated in triage.  Pt complains of dental pain. Pain to the right upper molar for 3 days. He has a dentist appt in 4 days, but pain is unbearable. No associated facial or neck swelling. No fevers. He has already been on antibiotics for this and the plan is for multiple teeth to be pulled ? ?Review of Systems  ?Positive:  ?Negative:  ? ?Physical Exam  ?BP (!) 138/98   Pulse 75   Temp 98.6 ?F (37 ?C)   Resp 18   Ht 5\' 9"  (1.753 m)   Wt 90.7 kg   SpO2 98%   BMI 29.53 kg/m?  ?Gen:   Awake, no distress   ?Resp:  Normal effort  ?MSK:   Moves extremities without difficulty  ?Other:  No appreciable facial swelling. No sign of ludwigs. Decay noted on exam ? ?Medical Decision Making  ?Medically screening exam initiated at 6:44 PM.  Appropriate orders placed.  [name] was informed that the remainder of the evaluation will be completed by another provider, this initial triage assessment does not replace that evaluation, and the importance of remaining in the ED until their evaluation is complete. ? ? ?  ?Baldo Ash, PA-C ?11/07/21 1845 ? ?

## 2021-11-07 NOTE — Discharge Instructions (Addendum)
It was a pleasure caring for you today in the emergency department. ? ?Please return to the emergency department for any worsening or worrisome symptoms. ? ? ?Please use listerine mouth rinse 3x daily.  ?Use topical oragel as needed. ?Swish and spit hydrogen peroxide orally, do not swallow. ?

## 2021-11-07 NOTE — ED Notes (Signed)
Patient states he is taking two ibuprofen for the pain ?

## 2021-11-07 NOTE — ED Triage Notes (Signed)
The pt is c/o  a toothache rt upper for 2-3 days.  The pain is no better. He has a dentisy that cannot see him for a few days ?

## 2021-11-07 NOTE — ED Provider Notes (Signed)
?[facility] EMERGENCY DEPARTMENT ?Provider Note ? ? ?CSN: [id number] ?Arrival date & time: 11/07/21  1830 ? ?  ? ?History ? ?Chief Complaint  ?Patient presents with  ? Dental Pain  ? ? ?[name] is a 46 y.o. male. ? ?This is a 46 y.o. male  with significant medical history as below, including tobacco use who presents to the ED with complaint of dental pain. ? ?Patient ongoing dental pain for approximately 1 week.  He has ointment with dentist on Friday.  Has been taking Motrin and Tylenol every few hours without significant relief of his pain.  No associated trauma.  No sore throat, difficulty breathing, throat swelling, fevers or chills, nausea or vomiting.  He is tolerant p.o. intake without significant difficulty. drinking liquids appropriately.  No change in bowel or bladder function. ? ? ? ? ?History reviewed. No pertinent past medical history. ? ?History reviewed. No pertinent surgical history.  ? ? ?The history is provided by the patient. No language interpreter was used.  ?Dental Pain ?Associated symptoms: no facial swelling, no fever and no headaches   ? ?  ? ?Home Medications ?Prior to Admission medications   ?Medication Sig Start Date End Date Taking? Authorizing Provider  ?acetaminophen (TYLENOL) 325 MG tablet Take 2 tablets (650 mg total) by mouth every 6 (six) hours as needed. 11/07/21  Yes Tanda Rockers A, DO  ?doxycycline (VIBRAMYCIN) 100 MG capsule Take 1 capsule (100 mg total) by mouth 2 (two) times daily. 11/07/21  Yes Tanda Rockers A, DO  ?lidocaine (XYLOCAINE) 2 % solution Use as directed 15 mLs in the mouth or throat every 3 (three) hours as needed for mouth pain. 11/07/21  Yes Tanda Rockers A, DO  ?oxyCODONE (ROXICODONE) 5 MG immediate release tablet Take 1 tablet (5 mg total) by mouth every 4 (four) hours as needed for severe pain. 11/07/21  Yes Tanda Rockers A, DO  ?alum & mag hydroxide-simeth (MAALOX MAX) 400-400-40 MG/5ML suspension Take 10 mLs by mouth every 6 (six) hours  as needed for indigestion. 02/22/21   Caccavale, Sophia, PA-C  ?amLODipine (NORVASC) 5 MG tablet Take 5 mg by mouth daily.    [provider]  ?escitalopram (LEXAPRO) 10 MG tablet Take 15 mg by mouth daily.    [provider]  ?gabapentin (NEURONTIN) 300 MG capsule Take 600 mg by mouth 3 (three) times daily.    [provider]  ?metoprolol tartrate (LOPRESSOR) 25 MG tablet Take 25 mg by mouth 2 (two) times daily.    [provider]  ?mirtazapine (REMERON SOL-TAB) 15 MG disintegrating tablet Take 7.5 mg by mouth at bedtime.    [provider]  ?ondansetron (ZOFRAN-ODT) 4 MG disintegrating tablet Take 1 tablet by mouth every 6 (six) hours as needed for nausea or vomiting.    [provider]  ?pantoprazole (PROTONIX) 40 MG tablet Take 40 mg by mouth 2 (two) times daily.    [provider]  ?OLANZapine (ZYPREXA) 10 MG tablet Take 10 mg by mouth at bedtime.  01/21/21  [provider]  ?   ? ?Allergies    ?Compazine [prochlorperazine], Lamotrigine, and Risperidone   ? ?Review of Systems   ?Review of Systems  ?Constitutional:  Negative for chills and fever.  ?HENT:  Positive for dental problem. Negative for facial swelling and trouble swallowing.   ?Eyes:  Negative for photophobia and visual disturbance.  ?Respiratory:  Negative for cough and shortness of breath.   ?Cardiovascular:  Negative for chest  pain and palpitations.  ?Gastrointestinal:  Negative for abdominal pain, nausea and vomiting.  ?Endocrine: Negative for polydipsia and polyuria.  ?Genitourinary:  Negative for difficulty urinating and hematuria.  ?Musculoskeletal:  Negative for gait problem and joint swelling.  ?Skin:  Negative for pallor and rash.  ?Neurological:  Negative for syncope and headaches.  ?Psychiatric/Behavioral:  Negative for agitation and confusion.   ? ?Physical Exam ?Updated Vital Signs ?BP (!) 138/98   Pulse 75   Temp 98.6 ?F (37 ?C)   Resp 18   Ht  (1.753 m)    Wt 90.7 kg   SpO2 98%   BMI 29.53 kg/m?  ?Physical Exam ?Vitals and nursing note reviewed.  ?Constitutional:   ?   General: He is not in acute distress. ?   Appearance: He is well-developed.  ?HENT:  ?   Head: Normocephalic and atraumatic. No raccoon eyes, Battle's sign, right periorbital erythema or left periorbital erythema.  ?   Jaw: There is normal jaw occlusion. No trismus or tenderness.  ?   Comments: No throat swelling, no neck swelling.  ?   Right Ear: External ear normal.  ?   Left Ear: External ear normal.  ?   Mouth/Throat:  ?   Mouth: Mucous membranes are moist. No angioedema.  ?   Dentition: Abnormal dentition. Dental tenderness and dental caries present.  ?   Pharynx: Oropharynx is clear. Uvula midline. No uvula swelling.  ?   Tonsils: No tonsillar abscesses.  ? ?   Comments: Dental caries noted. ?No drainable abscess appreciated on exam.   ?Uvula midline ?No evidence of deep space infection ?Tongue is not elevated, ?No drooling stridor or trismus ?Eyes:  ?   General: No scleral icterus. ?Cardiovascular:  ?   Rate and Rhythm: Normal rate and regular rhythm.  ?   Pulses: Normal pulses.  ?   Heart sounds: Normal heart sounds.  ?Pulmonary:  ?   Effort: Pulmonary effort is normal. No respiratory distress.  ?   Breath sounds: Normal breath sounds.  ?Abdominal:  ?   General: Abdomen is flat.  ?   Palpations: Abdomen is soft.  ?   Tenderness: There is no abdominal tenderness.  ?Musculoskeletal:     ?   General: Normal range of motion.  ?   Cervical back: Normal range of motion.  ?   Right lower leg: No edema.  ?   Left lower leg: No edema.  ?Skin: ?   General: Skin is warm and dry.  ?   Capillary Refill: Capillary refill takes less than 2 seconds.  ?Neurological:  ?   Mental Status: He is alert and oriented to person, place, and time.  ?Psychiatric:     ?   Mood and Affect: Mood normal.     ?   Behavior: Behavior normal.  ? ? ?ED Results / Procedures / Treatments   ?Labs ?(all labs ordered are listed, but  only abnormal results are displayed) ?Labs Reviewed - No data to display ? ?EKG ?None ? ?Radiology ?No results found. ? ?Procedures ?Procedures  ? ? ?Medications Ordered in ED ?Medications  ?ketorolac (TORADOL) injection 60 mg (has no administration in time range)  ?HYDROcodone-acetaminophen (NORCO/VICODIN) 5-325 MG per tablet 1 tablet (has no administration in time range)  ?lidocaine (XYLOCAINE) 2 % viscous mouth solution 15 mL (has no administration in time range)  ? ? ?ED Course/ Medical Decision Making/ A&P ?  ?                        ?  Medical Decision Making ?Risk ?Prescription drug management. ? ? ?Initial Impression and Ddx ?This patient presents to the Emergency Department for the above complaint. This involves an extensive number of treatment options and is a complaint that carries with it a high risk of complications and morbidity. Vital signs were reviewed.  ? ?Serious etiologies considered. Ddx includes but is not limited to: Dental abscess, dental caries, dental pain, Ludwig angina, ANUG ? ?Patient PMH that increases complexity of ED encounter: Poor outpatient follow-up ? ?Social determinants of health include tobacco use ? ? ?Previous records obtained and reviewed  ? ?Interpretation of Diagnostics ? ?PDMP reviewed  ? ? ?Patient Reassessment and Ultimate Disposition/Management ? ? ?Patient with dental pain.  I did discuss/offer dental block, patient does not want this.  We will give medication in the ER for analgesia.  Family member drove him. ? ?He has appoint with dentist on Friday. ? ?He is on Augmentin, will add doxycycline ? ?There is suspicion for possible ANUG, he is not immunocompromise, does not appear to be severe.  ? ?There is no clinical evidence of Ludwig angina.  No evidence of deep space infection to the pharynx.  No drainable abscess on examination to the mouth.  No submandibular swelling. ? ?He is using Magic mouthwash at home, advised adding hydroperoxide swish and spit and Listerine  swish and spit. ? ?We will give analgesics for home, viscous lidocaine. ? ?Advise he follow-up with dentist on Friday.  Return to the ER if worse.  Given local dental resources ? ? ?The patient improved signific

## 2021-12-25 ENCOUNTER — Emergency Department (HOSPITAL_COMMUNITY): Payer: Medicare Other

## 2021-12-25 ENCOUNTER — Emergency Department (HOSPITAL_COMMUNITY)
Admission: EM | Admit: 2021-12-25 | Discharge: 2021-12-25 | Discharge status: Against Medical Advice | Payer: Medicare Other | Attending: Emergency Medicine | Admitting: Emergency Medicine

## 2021-12-25 ENCOUNTER — Encounter (HOSPITAL_COMMUNITY): Payer: Self-pay | Admitting: Emergency Medicine

## 2021-12-25 ENCOUNTER — Other Ambulatory Visit: Payer: Self-pay

## 2021-12-25 DIAGNOSIS — R110 Nausea: Secondary | ICD-10-CM | POA: Diagnosis not present

## 2021-12-25 DIAGNOSIS — Z5321 Procedure and treatment not carried out due to patient leaving prior to being seen by health care provider: Secondary | ICD-10-CM | POA: Diagnosis not present

## 2021-12-25 DIAGNOSIS — R439 Unspecified disturbances of smell and taste: Secondary | ICD-10-CM | POA: Diagnosis not present

## 2021-12-25 DIAGNOSIS — R109 Unspecified abdominal pain: Secondary | ICD-10-CM | POA: Diagnosis present

## 2021-12-25 DIAGNOSIS — R630 Anorexia: Secondary | ICD-10-CM | POA: Diagnosis not present

## 2021-12-25 HISTORY — DX: Anxiety disorder, unspecified: F41.9

## 2021-12-25 HISTORY — DX: Sleep apnea, unspecified: G47.30

## 2021-12-25 HISTORY — DX: Depression, unspecified: F32.A

## 2021-12-25 LAB — URINALYSIS, ROUTINE W REFLEX MICROSCOPIC
Bilirubin Urine: NEGATIVE
Glucose, UA: NEGATIVE mg/dL
Hgb urine dipstick: NEGATIVE
Ketones, ur: NEGATIVE mg/dL
Leukocytes,Ua: NEGATIVE
Nitrite: NEGATIVE
Protein, ur: NEGATIVE mg/dL
Specific Gravity, Urine: 1.023 (ref 1.005–1.030)
pH: 5 (ref 5.0–8.0)

## 2021-12-25 LAB — COMPREHENSIVE METABOLIC PANEL
ALT: 69 U/L — ABNORMAL HIGH (ref 0–44)
AST: 28 U/L (ref 15–41)
Albumin: 4.2 g/dL (ref 3.5–5.0)
Alkaline Phosphatase: 71 U/L (ref 38–126)
Anion gap: 6 (ref 5–15)
BUN: 13 mg/dL (ref 6–20)
CO2: 28 mmol/L (ref 22–32)
Calcium: 9.4 mg/dL (ref 8.9–10.3)
Chloride: 106 mmol/L (ref 98–111)
Creatinine, Ser: 1.08 mg/dL (ref 0.61–1.24)
GFR, Estimated: >60 mL/min (ref >60)
Glucose, Bld: 88 mg/dL (ref 70–99)
Potassium: 4.5 mmol/L (ref 3.5–5.1)
Sodium: 140 mmol/L (ref 135–145)
Total Bilirubin: 1.1 mg/dL (ref 0.3–1.2)
Total Protein: 7.3 g/dL (ref 6.5–8.1)

## 2021-12-25 LAB — CBC
HCT: 55.5 % — ABNORMAL HIGH (ref 39.0–52.0)
Hemoglobin: 17.7 g/dL — ABNORMAL HIGH (ref 13.0–17.0)
MCH: 29 pg (ref 26.0–34.0)
MCHC: 31.9 g/dL (ref 30.0–36.0)
MCV: 90.8 fL (ref 80.0–100.0)
Platelets: 259 10*3/uL (ref 150–400)
RBC: 6.11 MIL/uL — ABNORMAL HIGH (ref 4.22–5.81)
RDW: 14.1 % (ref 11.5–15.5)
WBC: 9.8 10*3/uL (ref 4.0–10.5)
nRBC: 0 % (ref 0.0–0.2)

## 2021-12-25 LAB — LIPASE, BLOOD: Lipase: 32 U/L (ref 11–51)

## 2021-12-25 MED ORDER — ONDANSETRON 4 MG PO TBDP: 4.0000 mg | ORAL_TABLET | Freq: Once | ORAL | Status: DC | PRN

## 2021-12-25 NOTE — ED Triage Notes (Signed)
Patient c/o mid abdominal pain, sour taste, swollen abdomen, and loss of appetite onset of last week. Denies any urinary symptoms. Episode of diarrhea yesterday. Recently completed course of antibiotics for abscessed tooth.  ?

## 2021-12-25 NOTE — ED Provider Triage Note (Signed)
Emergency Medicine Provider Triage Evaluation Note ? ?[name] , a 46 y.o. male  was evaluated in triage.  Pt complains of stomach pains over the last week.  Accompanied with decreased appetite and some intermittent mild nausea.  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  Denies fever, headaches, vision changes, vomiting, diarrhea, constipation.  Uses CPAP.  Hx of GERD.  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  Hx of depression and anxiety.  When questioned of subjective CT contrast allergy, reaction is specifically "cold symptoms", and denies anaphylaxis or rash with this. ? ?Review of Systems  ?Positive: As above ?Negative: As above ? ?Physical Exam  ?BP (!) 129/101   Pulse 78   Temp 98.7 ?F (37.1 ?C) (Oral)   Resp 16   Ht 5\' 9"  (1.753 m)   Wt 90.3 kg   SpO2 96%   BMI 29.39 kg/m?  ?Gen:   Awake, no distress, sitting comfortably ?Resp:  Normal effort, CTAB. ?MSK:   Moves extremities without difficulty  ?Other:  RRR without M/R/G.  Epigastric and umbilical tenderness.  Abdomen soft.  Radial pulses 2+.  Chest non-TTP.  Afebrile. ? ?Medical Decision Making  ?Medically screening exam initiated at 5:21 PM.  Appropriate orders placed.  Samson Ralph was informed that the remainder of the evaluation will be completed by another provider, this initial triage assessment does not replace that evaluation, and the importance of remaining in the ED until their evaluation is complete. ? ?Labs, imaging ordered. ?  ?Baldo Ash, PA-C ?12/25/21 1732 ? ?

## 2022-01-09 ENCOUNTER — Emergency Department (HOSPITAL_COMMUNITY)
Admission: EM | Admit: 2022-01-09 | Discharge: 2022-01-09 | Disposition: A | Discharge status: Home / Self Care | Payer: Medicare Other | Attending: Emergency Medicine | Admitting: Emergency Medicine

## 2022-01-09 ENCOUNTER — Other Ambulatory Visit: Payer: Self-pay

## 2022-01-09 ENCOUNTER — Emergency Department (HOSPITAL_COMMUNITY): Payer: Medicare Other

## 2022-01-09 ENCOUNTER — Encounter (HOSPITAL_COMMUNITY): Payer: Self-pay | Admitting: Emergency Medicine

## 2022-01-09 DIAGNOSIS — J40 Bronchitis, not specified as acute or chronic: Secondary | ICD-10-CM | POA: Diagnosis not present

## 2022-01-09 DIAGNOSIS — Z87891 Personal history of nicotine dependence: Secondary | ICD-10-CM | POA: Diagnosis not present

## 2022-01-09 DIAGNOSIS — R1013 Epigastric pain: Secondary | ICD-10-CM

## 2022-01-09 DIAGNOSIS — R0602 Shortness of breath: Secondary | ICD-10-CM | POA: Diagnosis present

## 2022-01-09 LAB — CBC
HCT: 51.7 % (ref 39.0–52.0)
Hemoglobin: 16.5 g/dL (ref 13.0–17.0)
MCH: 28.6 pg (ref 26.0–34.0)
MCHC: 31.9 g/dL (ref 30.0–36.0)
MCV: 89.6 fL (ref 80.0–100.0)
Platelets: 225 10*3/uL (ref 150–400)
RBC: 5.77 MIL/uL (ref 4.22–5.81)
RDW: 13.4 % (ref 11.5–15.5)
WBC: 7.1 10*3/uL (ref 4.0–10.5)
nRBC: 0 % (ref 0.0–0.2)

## 2022-01-09 LAB — URINALYSIS, ROUTINE W REFLEX MICROSCOPIC
Bilirubin Urine: NEGATIVE
Glucose, UA: NEGATIVE mg/dL
Hgb urine dipstick: NEGATIVE
Ketones, ur: NEGATIVE mg/dL
Leukocytes,Ua: NEGATIVE
Nitrite: NEGATIVE
Protein, ur: NEGATIVE mg/dL
Specific Gravity, Urine: 1.018 (ref 1.005–1.030)
pH: 5 (ref 5.0–8.0)

## 2022-01-09 LAB — COMPREHENSIVE METABOLIC PANEL
ALT: 48 U/L — ABNORMAL HIGH (ref 0–44)
AST: 32 U/L (ref 15–41)
Albumin: 3.8 g/dL (ref 3.5–5.0)
Alkaline Phosphatase: 69 U/L (ref 38–126)
Anion gap: 6 (ref 5–15)
BUN: 11 mg/dL (ref 6–20)
CO2: 27 mmol/L (ref 22–32)
Calcium: 8.9 mg/dL (ref 8.9–10.3)
Chloride: 108 mmol/L (ref 98–111)
Creatinine, Ser: 0.96 mg/dL (ref 0.61–1.24)
GFR, Estimated: >60 mL/min (ref >60)
Glucose, Bld: 109 mg/dL — ABNORMAL HIGH (ref 70–99)
Potassium: 4.3 mmol/L (ref 3.5–5.1)
Sodium: 141 mmol/L (ref 135–145)
Total Bilirubin: 0.7 mg/dL (ref 0.3–1.2)
Total Protein: 6.6 g/dL (ref 6.5–8.1)

## 2022-01-09 LAB — D-DIMER, QUANTITATIVE: D-Dimer, Quant: <0.27 ug/mL-FEU (ref 0.00–0.50)

## 2022-01-09 LAB — TROPONIN I (HIGH SENSITIVITY): Troponin I (High Sensitivity): 4 ng/L (ref <18)

## 2022-01-09 LAB — LIPASE, BLOOD: Lipase: 34 U/L (ref 11–51)

## 2022-01-09 MED ORDER — LIDOCAINE VISCOUS HCL 2 % MT SOLN
15.0000 mL | Freq: Once | OROMUCOSAL | Status: AC
  Administered 2022-01-09: 15 mL via ORAL
  Filled 2022-01-09: qty 15

## 2022-01-09 MED ORDER — SUCRALFATE 1 G PO TABS
1.0000 g | ORAL_TABLET | Freq: Once | ORAL | Status: DC
  Filled 2022-01-09: qty 1

## 2022-01-09 MED ORDER — NICOTINE 14 MG/24HR TD PT24: 14.0000 mg | MEDICATED_PATCH | Freq: Every day | TRANSDERMAL | 0 refills | Status: DC

## 2022-01-09 MED ORDER — METOCLOPRAMIDE HCL 10 MG PO TABS: 10.0000 mg | ORAL_TABLET | Freq: Four times a day (QID) | ORAL | 0 refills | Status: DC | PRN

## 2022-01-09 MED ORDER — ALUM & MAG HYDROXIDE-SIMETH 200-200-20 MG/5ML PO SUSP
30.0000 mL | Freq: Once | ORAL | Status: AC
  Administered 2022-01-09: 30 mL via ORAL
  Filled 2022-01-09: qty 30

## 2022-01-09 MED ORDER — NICOTINE 14 MG/24HR TD PT24: 14.0000 mg | MEDICATED_PATCH | Freq: Every day | TRANSDERMAL | 0 refills | Status: AC

## 2022-01-09 MED ORDER — ONDANSETRON 4 MG PO TBDP
8.0000 mg | ORAL_TABLET | Freq: Once | ORAL | Status: AC
  Administered 2022-01-09: 8 mg via ORAL
  Filled 2022-01-09: qty 2

## 2022-01-09 NOTE — ED Provider Notes (Signed)
[facility] EMERGENCY DEPARTMENT Provider Note   CSN: [id number] Arrival date & time: 01/09/22  1103     History PMH: HTN, GAD, MDD, tobacco user  Chief Complaint  Patient presents with   Abdominal Pain   Chest Pain   Shortness of Breath    [name] is a 46 y.o. male. Presents with a chief complaint of bronchitis symptoms.  He states about 2 weeks ago he had cough, sore throat, and congestion.  The cough slowly got worse and became more productive.  He is unsure of the color of his sputum.  He has had associated shortness of breath and wheezing.  He was seen by his PCP in [place] and she placed him on azithromycin.  He took this for 5 days and he saw some relief, but is still having shortness of breath.  He does have some chest tightness that is present when he coughs.  This is not present at rest.  Says that he quit smoking around that time and has been using the nicotine gum which has been making him really jittery.  He has baseline anxiety so this is making it worse.  He denies any new leg swelling, no recent travel, not on blood thinners, no history of blood clot, no hemoptysis.  He also complains of intermittent epigastric abdominal pain, nausea, and decreased appetite. This has been ongoing since he had abdominal surgery at [place] 1 year ago.  He states that he has had an extensive work-up with gastroenterology and has not found any answers.  He is looking for a second opinion.  He states the epigastric pain is constant.  Is worse after eating.  He describes it as a burning stabbing sensation.  Nothing really makes it better.  He has not had any acute changes in the symptoms.   Abdominal Pain Associated symptoms: chest pain, cough, nausea, shortness of breath and sore throat   Associated symptoms: no chills, no fever and no vomiting   Chest Pain Associated symptoms: abdominal pain, cough, nausea and shortness of breath   Associated symptoms: no fever, no  palpitations and no vomiting   Shortness of Breath Associated symptoms: abdominal pain, chest pain, cough, sore throat and wheezing   Associated symptoms: no fever and no vomiting       Home Medications Prior to Admission medications   Medication Sig Start Date End Date Taking? Authorizing Provider  acetaminophen (TYLENOL) 325 MG tablet Take 2 tablets (650 mg total) by mouth every 6 (six) hours as needed. 11/07/21   Jeanell Sparrow, DO  alum & mag hydroxide-simeth (MAALOX MAX) 400-400-40 MG/5ML suspension Take 10 mLs by mouth every 6 (six) hours as needed for indigestion. 02/22/21   Caccavale, Sophia, PA-C  amLODipine (NORVASC) 5 MG tablet Take 5 mg by mouth daily.    [provider]  doxycycline (VIBRAMYCIN) 100 MG capsule Take 1 capsule (100 mg total) by mouth 2 (two) times daily. 11/07/21   Jeanell Sparrow, DO  escitalopram (LEXAPRO) 10 MG tablet Take 15 mg by mouth daily.    [provider]  gabapentin (NEURONTIN) 300 MG capsule Take 600 mg by mouth 3 (three) times daily.    [provider]  lidocaine (XYLOCAINE) 2 % solution Use as directed 15 mLs in the mouth or throat every 3 (three) hours as needed for mouth pain. 11/07/21   Jeanell Sparrow, DO  metoprolol tartrate (LOPRESSOR) 25 MG tablet Take 25 mg by mouth 2 (two) times daily.  [provider]  mirtazapine (REMERON SOL-TAB) 15 MG disintegrating tablet Take 7.5 mg by mouth at bedtime.    [provider]  nicotine (NICODERM CQ - DOSED IN MG/24 HOURS) 14 mg/24hr patch Place 1 patch (14 mg total) onto the skin daily for 7 days. 01/09/22 01/16/22  Nieves Barberi, Adora Fridge, PA-C  ondansetron (ZOFRAN-ODT) 4 MG disintegrating tablet Take 1 tablet by mouth every 6 (six) hours as needed for nausea or vomiting.    [provider]  oxyCODONE (ROXICODONE) 5 MG immediate release tablet Take 1 tablet (5 mg total) by mouth every 4 (four) hours as needed for severe pain. 11/07/21   Jeanell Sparrow, DO   pantoprazole (PROTONIX) 40 MG tablet Take 40 mg by mouth 2 (two) times daily.    [provider]  OLANZapine (ZYPREXA) 10 MG tablet Take 10 mg by mouth at bedtime.  01/21/21  [provider]      Allergies    Compazine [prochlorperazine], Lamotrigine, and Risperidone    Review of Systems   Review of Systems  Constitutional:  Positive for appetite change. Negative for chills and fever.  HENT:  Positive for congestion and sore throat.   Respiratory:  Positive for cough, shortness of breath and wheezing.   Cardiovascular:  Positive for chest pain. Negative for palpitations and leg swelling.  Gastrointestinal:  Positive for abdominal pain and nausea. Negative for vomiting.  All other systems reviewed and are negative.  Physical Exam Updated Vital Signs BP (!) 135/93   Pulse 62   Temp 98 F (36.7 C) (Oral)   Resp 15   Ht 5\' 9"  (1.753 m)   Wt 90 kg   SpO2 97%   BMI 29.30 kg/m  Physical Exam Vitals and nursing note reviewed.  Constitutional:      General: He is not in acute distress.    Appearance: Normal appearance. He is not ill-appearing, toxic-appearing or diaphoretic.  HENT:     Head: Normocephalic and atraumatic.     Nose: No nasal deformity.     Mouth/Throat:     Lips: Pink. No lesions.     Mouth: Mucous membranes are moist. No injury, lacerations, oral lesions or angioedema.     Pharynx: Oropharynx is clear. Uvula midline. No pharyngeal swelling, oropharyngeal exudate, posterior oropharyngeal erythema or uvula swelling.  Eyes:     General: Gaze aligned appropriately. No scleral icterus.       Right eye: No discharge.        Left eye: No discharge.     Conjunctiva/sclera: Conjunctivae normal.     Right eye: Right conjunctiva is not injected. No exudate or hemorrhage.    Left eye: Left conjunctiva is not injected. No exudate or hemorrhage. Cardiovascular:     Rate and Rhythm: Normal rate and regular rhythm.     Pulses: Normal pulses.          Radial  pulses are 2+ on the right side and 2+ on the left side.       Dorsalis pedis pulses are 2+ on the right side and 2+ on the left side.     Heart sounds: Normal heart sounds, S1 normal and S2 normal. Heart sounds not distant. No murmur heard.   No friction rub. No gallop. No S3 or S4 sounds.  Pulmonary:     Effort: Pulmonary effort is normal. No accessory muscle usage or respiratory distress.     Breath sounds: Normal breath sounds. No stridor. No wheezing, rhonchi or rales.  Chest:     Chest wall: No tenderness.  Abdominal:     General: Abdomen is flat. There is no distension.     Palpations: Abdomen is soft. There is no mass or pulsatile mass.     Tenderness: There is abdominal tenderness in the epigastric area. There is no guarding or rebound. Negative signs include Murphy's sign.     Comments: Mild epigastric tenderness. No guarding or rigidity  Musculoskeletal:     Right lower leg: No edema.     Left lower leg: No edema.  Skin:    General: Skin is warm and dry.     Coloration: Skin is not jaundiced or pale.     Findings: No bruising, erythema, lesion or rash.  Neurological:     General: No focal deficit present.     Mental Status: He is alert and oriented to person, place, and time.     GCS: GCS eye subscore is 4. GCS verbal subscore is 5. GCS motor subscore is 6.  Psychiatric:        Mood and Affect: Mood normal.        Behavior: Behavior normal. Behavior is cooperative.    ED Results / Procedures / Treatments   Labs (all labs ordered are listed, but only abnormal results are displayed) Labs Reviewed  COMPREHENSIVE METABOLIC PANEL - Abnormal; Notable for the following components:      Result Value   Glucose, Bld 109 (*)    ALT 48 (*)    All other components within normal limits  LIPASE, BLOOD  CBC  URINALYSIS, ROUTINE W REFLEX MICROSCOPIC  D-DIMER, QUANTITATIVE  TROPONIN I (HIGH SENSITIVITY)    EKG EKG Interpretation  Date/Time:  Wednesday Jan 09 2022 11:23:16  EDT Ventricular Rate:  70 PR Interval:  162 QRS Duration: 86 QT Interval:  396 QTC Calculation: 427 R Axis:   26 Text Interpretation: Normal sinus rhythm Normal ECG When compared with ECG of 22-Feb-2021 11:55, PREVIOUS ECG IS PRESENT Confirmed by Octaviano Glow [phone]) on 01/09/2022 12:02:51 PM  Radiology DG Chest 2 View  Result Date: 01/09/2022 CLINICAL DATA:  Shortness of breath. Epigastric pain off and on since abdominal surgery 1 year ago. Shortness of breath and cough. EXAM: CHEST - 2 VIEW COMPARISON:  Chest two views 12/25/2021 and 11/25/2009; CT abdomen and pelvis 02/22/2021 FINDINGS: Cardiac silhouette and mediastinal contours are within normal limits. There is again moderate elevation of the left hemidiaphragm. Mild associated left basilar linear likely subsegmental atelectasis appears unchanged from 12/25/2021. No definite pleural effusion. No pneumothorax is seen. Mild dextrocurvature of the mid to lower thoracic spine. IMPRESSION: No significant change from 12/25/2021. Chronic elevation of the left hemidiaphragm with probable left basilar atelectasis. No definite pneumonia. Electronically Signed   By: Yvonne Kendall M.D.   On: 01/09/2022 12:10    Procedures Procedures  This patient was on telemetry or cardiac monitoring during their time in the ED.    Medications Ordered in ED Medications  sucralfate (CARAFATE) tablet 1 g (1 g Oral Patient Refused/Not Given 01/09/22 1308)  alum & mag hydroxide-simeth (MAALOX/MYLANTA) 200-200-20 MG/5ML suspension 30 mL (30 mLs Oral Given 01/09/22 1308)    And  lidocaine (XYLOCAINE) 2 % viscous mouth solution 15 mL (15 mLs Oral Given 01/09/22 1308)  ondansetron (ZOFRAN-ODT) disintegrating tablet 8 mg (8 mg Oral Given 01/09/22 1357)    ED Course/ Medical Decision Making/ A&P  Medical Decision Making Amount and/or Complexity of Data Reviewed Labs: ordered. Radiology: ordered.  Risk OTC drugs. Prescription drug  management.    MDM  This is a 46 y.o. male who presents to the ED with cough and shortness of breath, also here with abdominal pain The differential of this patient includes but is not limited to bronchitis, PE, ACS, COPD/Asthma, PUD, GERD, gastritis, esophagitis  My Impression, Plan, and ED Course: Patient well-appearing.  He has normal vitals. He is here with multiple complaints. Primarily, he has been having URI symptoms with some associated shortness of breath and wheezing over the past week and a half.  He is just finished a 5-day course of this Azithromycin which have not improved his symptoms. He refuses albuterol inhaler and steroid treatment. Will add on d dimer to r/o PE as he is low risk for this.  He is also asking for a second opinion on his abdominal pain. This has been ongoing for 6 months and he is followed by [facility]. Symptoms have been unchanged. It sounds like gastritis, GERD, or PUD. I do not think he needs any further workup in the emergency department. I offered him follow up with our GI team for a second opinion.   I personally ordered, reviewed, and interpreted all laboratory work and imaging and agree with radiologist interpretation. Results interpreted below: CBC with no leukocytosis or Anemia. CMP with baseline elevated ALT to 48. No other abnormalities. Lipase 34. Troponin normal, D dimer normal. UA without abnormal findings.  CXR without significant change from recent image earlier this month. No acute findings. EKG NSR.  Reassuring workup.   Abdominal pain - Gave GI cocktail and zofran here with improvement of symptoms. F/u with gastroenterology. No further intervention or workup needed  Bronchitis:  - Doubt PE at this point. Doubt ACS. CXR stable with no evidence of PNA. Likely viral bronchitis. Do not think he would benefit from more antibiotics at this point. Offered steroid and inhaler but patient refused. Recommend supportive treatment and f/u with PCP.      Charting Requirements Additional history is obtained from:  Independent historian External Records from outside source obtained and reviewed including: Reviewed prior cxr, labs, prior GI notes Social Determinants of Health:  none Pertinant PMH that complicates patient's illness: n/a  Patient Care Problems that were addressed during this visit: - Bronchitis: Acute illness with systemic symptoms - Epigastric Abdominal Pain: Chronic illness This patient was maintained on a cardiac monitor/telemetry. I personally viewed and interpreted the cardiac monitor which reveals an underlying rhythm of NSR Medications given in ED: GI cocktail, zofran Reevaluation of the patient after these medicines showed that the patient improved I have reviewed home medications and made changes accordingly. Disposition: discharge  This is a supervised visit with my attending physician, Dr. Langston Masker. We have discussed this patient and they have altered the plan as needed.  Portions of this note were generated with [employer]. Dictation errors may occur despite best attempts at proofreading.      Final Clinical Impression(s) / ED Diagnoses Final diagnoses:  Bronchitis  Epigastric pain    Rx / DC Orders ED Discharge Orders          Ordered    nicotine (NICODERM CQ - DOSED IN MG/24 HOURS) 14 mg/24hr patch  Daily,   Status:  Discontinued        01/09/22 1347    metoCLOPramide (REGLAN) 10 MG tablet  Every 6 hours PRN,   Status:  Discontinued        01/09/22 1347    nicotine (NICODERM CQ - DOSED IN MG/24 HOURS) 14 mg/24hr patch  Daily        01/09/22 1349              Taeveon Keesling, Cherlyn Roberts 01/09/22 1622    Wyvonnia Dusky, MD 01/10/22 1030

## 2022-01-09 NOTE — ED Notes (Signed)
Patient transported to X-ray 

## 2022-01-09 NOTE — ED Triage Notes (Signed)
Pt here for continued epigastric pain that has been off and on since pt had abd surgery at [facility] 1 year ago. Pt reports last week he got a "bronchial infection" and just finished his Z-pak but is still having shob, cough, and chest tightness. Pt reports chills, nausea and constipation.

## 2022-01-09 NOTE — Discharge Instructions (Addendum)
You were seen in the Emergency Department today. Your D dimer was normal and we do not need to pursue any CT imaging today. You likely have bronchitis which is typically viral and will resolve on its own. Please continue taking cough medication and follow up with your PCP regarding this. I recommend continuing tobacco cessation. I have prescribed you a Nicotine patch to try. If this works for you, please have your PCP refill this.   I have also given you a referral to our Gastroenterology doctors. You should call the phone number in your discharge paperwork to set up an appointment for a second opinion.

## 2022-02-16 ENCOUNTER — Ambulatory Visit (HOSPITAL_COMMUNITY)
Admission: EM | Admit: 2022-02-16 | Discharge: 2022-02-16 | Disposition: A | Discharge status: Home / Self Care | Payer: Medicare Other | Attending: Internal Medicine | Admitting: Internal Medicine

## 2022-02-16 ENCOUNTER — Encounter (HOSPITAL_COMMUNITY): Payer: Self-pay

## 2022-02-16 ENCOUNTER — Encounter (HOSPITAL_COMMUNITY): Payer: Self-pay | Admitting: Emergency Medicine

## 2022-02-16 ENCOUNTER — Emergency Department (HOSPITAL_COMMUNITY)
Admission: EM | Admit: 2022-02-16 | Discharge: 2022-02-16 | Discharge status: Against Medical Advice | Payer: Medicare Other | Attending: Emergency Medicine | Admitting: Emergency Medicine

## 2022-02-16 ENCOUNTER — Other Ambulatory Visit: Payer: Self-pay

## 2022-02-16 ENCOUNTER — Ambulatory Visit (INDEPENDENT_AMBULATORY_CARE_PROVIDER_SITE_OTHER): Payer: Medicare Other

## 2022-02-16 DIAGNOSIS — B370 Candidal stomatitis: Secondary | ICD-10-CM | POA: Diagnosis not present

## 2022-02-16 DIAGNOSIS — R051 Acute cough: Secondary | ICD-10-CM

## 2022-02-16 DIAGNOSIS — J40 Bronchitis, not specified as acute or chronic: Secondary | ICD-10-CM | POA: Insufficient documentation

## 2022-02-16 DIAGNOSIS — R059 Cough, unspecified: Secondary | ICD-10-CM

## 2022-02-16 DIAGNOSIS — Z5321 Procedure and treatment not carried out due to patient leaving prior to being seen by health care provider: Secondary | ICD-10-CM | POA: Insufficient documentation

## 2022-02-16 MED ORDER — NYSTATIN 100000 UNIT/ML MT SUSP
500000.0000 [IU] | Freq: Four times a day (QID) | OROMUCOSAL | 0 refills | Status: AC
Start: 2022-02-16 — End: ?

## 2022-02-16 MED ORDER — CLINDAMYCIN HCL 300 MG PO CAPS: 300.0000 mg | ORAL_CAPSULE | Freq: Three times a day (TID) | ORAL | 0 refills | Status: AC

## 2022-02-16 NOTE — ED Notes (Signed)
Pt stated "the ED got slammed packed and that pt would rather go to a walk-in clinic to be seen". Pt gave this NT labels and seen leaving.

## 2022-02-16 NOTE — ED Triage Notes (Signed)
Pt reports a bronchial infection x 2 weeks and was taking amoxicillin. States the antibiotics has not been helping. Also reports having Thrush. States his doctor prescribed a magic mouth wash and the solution had benadryl in it, in which he is allergic too.

## 2022-02-16 NOTE — ED Triage Notes (Signed)
Patient here to be re-evaluated for bronchial infection and taking amoxicillin-also concerned with all the antibiotics has thrush. Patient in NAD and alert and oriented

## 2022-02-16 NOTE — Discharge Instructions (Addendum)
Take clindamycin 3 times daily for the next 7 days.   You may continue to use your promethazine DM cough syrup for your cough.  You may swish and swallow 5 mL of nystatin mouthwash to treat your oral thrush 4 times daily for the next 5 to 7 days.  If you develop any new or worsening symptoms or do not improve in the next 2 to 3 days, please return.  If your symptoms are severe, please go to the emergency room.  Follow-up with your primary care provider for further evaluation and management of your symptoms as well as ongoing wellness visits.  I hope you feel better!

## 2022-02-16 NOTE — ED Provider Notes (Signed)
[facility]    CSN: [id number] Arrival date & time: 02/16/22  1329      History   Chief Complaint Chief Complaint  Patient presents with   Cough   chest congestion    Thrush    HPI [name] is a 46 y.o. male.   Patient presents to urgent care for evaluation of 2-week history of cough, chest congestion, and mild nasal congestion.  He was seen at an urgent care in [place] on February 08, 2022 and prescribed doxycycline to treat bronchitis.  Patient took 2 doses of this and states that the doxycycline made his heart race.  He called the urgent care back and was given a prescription for amoxicillin twice daily 875 mg for 10 days.  Patient has taken 4 days worth of this medication and states "it does not work" and "I need something to clear this up".  Patient was also recently on clindamycin and took the full course of this medication for a dental infection for approximately 1 month ago.  He was prescribed Magic mouthwash with Benadryl to treat a thrush infection that developed as a result of the recent antibiotic use, but states that he is unable to take this because of the Benadryl that is in it.  He is requesting a different medication to treat his oral thrush today.  Patient is a current every day tobacco smoker and frequently gets bronchitis.  The provider at the [facility] urgent care also recommended patient to take an albuterol inhaler, but patient states "I do not know why she gave this to me does not she know it is only going to make my heart race more".  Patient told provider that she can send the albuterol inhaler to the pharmacy but that he would not use it.  He has not picked this up from the pharmacy or used it to help with his cough.  Provider in [place] also recommended steroids, but patient states that steroids "make his mental health really bad".  He has been taking promethazine cough syrup at nighttime to help with his chest congestion and cough with some relief.  He  is requesting antibiotic therapy today to "clear up his chest congestion". He reports that azithromycin has worked well in the past, but that it caused him to become "filled with rage" and he "had to have his psychiatrist go up on his benzodiazepines". He denies nausea, vomiting, diarrhea, abdominal pain, dizziness, fever/chills at home, purulent sputum, hemoptysis, night sweats, headache, and urinary symptoms.   Cough   Past Medical History:  Diagnosis Date   Anxiety    Depression    Sleep apnea     Patient Active Problem List   Diagnosis Date Noted   Major depressive disorder, recurrent episode, moderate (HCC) 01/21/2021   GAD (generalized anxiety disorder) 09/12/2020   Suicidal ideation 09/12/2020   History of suicide attempt 09/12/2020   ANXIETY 05/30/2009   ESSENTIAL HYPERTENSION, BENIGN 05/30/2009   PALPITATIONS, RECURRENT 05/30/2009    Past Surgical History:  Procedure Laterality Date   ABDOMINAL SURGERY         Home Medications    Prior to Admission medications   Medication Sig Start Date End Date Taking? Authorizing Provider  acetaminophen (TYLENOL) 325 MG tablet Take 2 tablets (650 mg total) by mouth every 6 (six) hours as needed. 11/07/21   Sloan Leiter, DO  alum & mag hydroxide-simeth (MAALOX MAX) 400-400-40 MG/5ML suspension Take 10 mLs by mouth every 6 (six) hours as  needed for indigestion. 02/22/21   Caccavale, Sophia, PA-C  amLODipine (NORVASC) 5 MG tablet Take 5 mg by mouth daily.    [provider]  clindamycin (CLEOCIN) 300 MG capsule Take 1 capsule (300 mg total) by mouth 3 (three) times daily for 7 days. 02/16/22 02/23/22 Yes Starsha Morning, Donavan Burnet, FNP  doxycycline (VIBRAMYCIN) 100 MG capsule Take 1 capsule (100 mg total) by mouth 2 (two) times daily. 11/07/21   Sloan Leiter, DO  escitalopram (LEXAPRO) 10 MG tablet Take 15 mg by mouth daily.    [provider]  gabapentin (NEURONTIN) 300 MG capsule Take 600 mg by mouth 3 (three) times  daily.    [provider]  lidocaine (XYLOCAINE) 2 % solution Use as directed 15 mLs in the mouth or throat every 3 (three) hours as needed for mouth pain. 11/07/21   Sloan Leiter, DO  metoprolol tartrate (LOPRESSOR) 25 MG tablet Take 25 mg by mouth 2 (two) times daily.    [provider]  mirtazapine (REMERON SOL-TAB) 15 MG disintegrating tablet Take 7.5 mg by mouth at bedtime.    [provider]  nystatin (MYCOSTATIN) 100000 UNIT/ML suspension Take 5 mLs (500,000 Units total) by mouth 4 (four) times daily. 02/16/22  Yes Carlisle Beers, FNP  ondansetron (ZOFRAN-ODT) 4 MG disintegrating tablet Take 1 tablet by mouth every 6 (six) hours as needed for nausea or vomiting.    [provider]  oxyCODONE (ROXICODONE) 5 MG immediate release tablet Take 1 tablet (5 mg total) by mouth every 4 (four) hours as needed for severe pain. 11/07/21   Sloan Leiter, DO  pantoprazole (PROTONIX) 40 MG tablet Take 40 mg by mouth 2 (two) times daily.    [provider]  OLANZapine (ZYPREXA) 10 MG tablet Take 10 mg by mouth at bedtime.  01/21/21  [provider]    Family History History reviewed. No pertinent family history.  Social History Social History   Tobacco Use   Smoking status: Former    Packs/day: 0.50    Types: Cigarettes    Quit date: 12/29/2021    Years since quitting: 0.1   Smokeless tobacco: Never  Substance Use Topics   Alcohol use: Not Currently   Drug use: Not Currently     Allergies   Antihistamines, chlorpheniramine-type; Compazine [prochlorperazine]; Diphenhydramine; Lamotrigine; and Risperidone   Review of Systems Review of Systems  Respiratory:  Positive for cough.   Per HPI   Physical Exam Triage Vital Signs ED Triage Vitals  Enc Vitals Group     BP 02/16/22 1424 (!) 141/96     Pulse Rate 02/16/22 1424 73     Resp 02/16/22 1424 18     Temp 02/16/22 1424 98.2 F (36.8 C)     Temp Source 02/16/22 1424 Oral      SpO2 02/16/22 1424 96 %     Weight --      Height --      Head Circumference --      Peak Flow --      Pain Score 02/16/22 1422 0     Pain Loc --      Pain Edu? --      Excl. in GC? --    No data found.  Updated Vital Signs BP (!) 141/96 (BP Location: Right Arm)   Pulse 73   Temp 98.2 F (36.8 C) (Oral)   Resp 18   SpO2 96%   Visual Acuity Right Eye Distance:  Left Eye Distance:   Bilateral Distance:    Right Eye Near:   Left Eye Near:    Bilateral Near:     Physical Exam Vitals and nursing note reviewed.  Constitutional:      Appearance: Normal appearance. He is not ill-appearing or toxic-appearing.     Comments: Very pleasant patient sitting on exam in position of comfort table in no acute distress.   HENT:     Head: Normocephalic and atraumatic.     Right Ear: Hearing and external ear normal.     Left Ear: Hearing and external ear normal.     Nose: Nose normal.     Mouth/Throat:     Lips: Pink.     Mouth: Mucous membranes are moist.     Tongue: Lesions present.     Comments: White lesions to tongue consistent with oral thrush present. No posterior oropharynx erythema or swelling. Airway intact. Eyes:     General: Lids are normal. Vision grossly intact. Gaze aligned appropriately.     Extraocular Movements: Extraocular movements intact.     Conjunctiva/sclera: Conjunctivae normal.  Cardiovascular:     Rate and Rhythm: Normal rate and regular rhythm.     Heart sounds: Normal heart sounds, S1 normal and S2 normal.  Pulmonary:     Effort: Pulmonary effort is normal. No respiratory distress.     Breath sounds: Normal air entry. Examination of the left-lower field reveals decreased breath sounds. Decreased breath sounds present. No wheezing, rhonchi or rales.     Comments: No acute respiratory distress. Abdominal:     Palpations: Abdomen is soft.  Musculoskeletal:     Cervical back: Neck supple.  Skin:    General: Skin is warm and dry.     Capillary Refill:  Capillary refill takes less than 2 seconds.     Findings: No rash.  Neurological:     General: No focal deficit present.     Mental Status: He is alert and oriented to person, place, and time. Mental status is at baseline.     Cranial Nerves: No dysarthria or facial asymmetry.     Gait: Gait is intact.  Psychiatric:        Attention and Perception: Attention normal.        Mood and Affect: Mood normal.        Speech: Speech normal.        Behavior: Behavior normal.        Thought Content: Thought content normal.        Judgment: Judgment normal.      UC Treatments / Results  Labs (all labs ordered are listed, but only abnormal results are displayed) Labs Reviewed - No data to display  EKG   Radiology DG Chest 2 View  Result Date: 02/16/2022 CLINICAL DATA:  2 week history of cough. EXAM: CHEST - 2 VIEW COMPARISON:  01/09/2022 FINDINGS: Stable asymmetric elevation left hemidiaphragm. The lungs are clear without focal pneumonia, edema, pneumothorax or pleural effusion. Streaky opacity at the left base is compatible with atelectasis or scarring. Cardiopericardial silhouette is at upper limits of normal for size. The visualized bony structures of the thorax are unremarkable. IMPRESSION: Stable. Asymmetric elevation left hemidiaphragm with atelectasis or scarring at the left base. Electronically Signed   By: Kennith Center M.D.   On: 02/16/2022 16:15    Procedures Procedures (including critical care time)  Medications Ordered in UC Medications - No data to display  Initial Impression / Assessment and Plan /  UC Course  I have reviewed the triage vital signs and the nursing notes.  Pertinent labs & imaging results that were available during my care of the patient were reviewed by me and considered in my medical decision making (see chart for details).  1.  Acute cough Chest x-ray obtained due to length of symptoms. X-ray shows stable asymmetric elevation of the left hemidiaphragm and  negative for focal pneumonia or pleural effusion. Plan to prescribe clindamycin as azithromycin has worked well to help resolve acute flare of chronic bronchitis in the past.  Patient hesitant to take medications due to potential psychiatric side effects.  Discussed medication choice at length with patient and instructed him to follow-up with his psychiatrist as needed.  Patient declines treatment with albuterol inhaler and short course of steroid burst due to potential side effects of medications.  Recommend follow-up with primary care provider for further evaluation and management of chronic bronchitis.  2.  Oral thrush Nystatin mouthwash prescribed to be used 4 times daily for the next 5 to 7 days to treat acute oral thrush infection.   Discussed physical exam and available lab work findings in clinic with patient.  Counseled patient regarding appropriate use of medications and potential side effects for all medications recommended or prescribed today. Discussed red flag signs and symptoms of worsening condition,when to call the PCP office, return to urgent care, and when to seek higher level of care in the emergency department. Patient verbalizes understanding and agreement with plan. All questions answered. Patient discharged in stable condition.  Final Clinical Impressions(s) / UC Diagnoses   Final diagnoses:  Acute cough  Bronchitis  Thrush, oral     Discharge Instructions      Take clindamycin 3 times daily for the next 7 days.   You may continue to use your promethazine DM cough syrup for your cough.  You may swish and swallow 5 mL of nystatin mouthwash to treat your oral thrush 4 times daily for the next 5 to 7 days.  If you develop any new or worsening symptoms or do not improve in the next 2 to 3 days, please return.  If your symptoms are severe, please go to the emergency room.  Follow-up with your primary care provider for further evaluation and management of your symptoms as  well as ongoing wellness visits.  I hope you feel better!     ED Prescriptions     Medication Sig Dispense Auth. Provider   nystatin (MYCOSTATIN) 100000 UNIT/ML suspension Take 5 mLs (500,000 Units total) by mouth 4 (four) times daily. 60 mL Reita May M, FNP   clindamycin (CLEOCIN) 300 MG capsule Take 1 capsule (300 mg total) by mouth 3 (three) times daily for 7 days. 21 capsule Carlisle Beers, FNP      PDMP not reviewed this encounter.   Carlisle Beers, [place] 02/19/22 1817

## 2022-03-22 ENCOUNTER — Encounter (HOSPITAL_COMMUNITY): Payer: Self-pay

## 2022-03-22 ENCOUNTER — Emergency Department (HOSPITAL_COMMUNITY)
Admission: EM | Admit: 2022-03-22 | Discharge: 2022-03-22 | Discharge status: Against Medical Advice | Payer: Medicare Other | Attending: Emergency Medicine | Admitting: Emergency Medicine

## 2022-03-22 ENCOUNTER — Other Ambulatory Visit: Payer: Self-pay

## 2022-03-22 DIAGNOSIS — Z91040 Latex allergy status: Secondary | ICD-10-CM | POA: Insufficient documentation

## 2022-03-22 DIAGNOSIS — R1312 Dysphagia, oropharyngeal phase: Secondary | ICD-10-CM | POA: Insufficient documentation

## 2022-03-22 DIAGNOSIS — R1310 Dysphagia, unspecified: Secondary | ICD-10-CM | POA: Diagnosis present

## 2022-03-22 DIAGNOSIS — Z79899 Other long term (current) drug therapy: Secondary | ICD-10-CM | POA: Diagnosis not present

## 2022-03-22 DIAGNOSIS — R451 Restlessness and agitation: Secondary | ICD-10-CM | POA: Insufficient documentation

## 2022-03-22 MED ORDER — LIDOCAINE VISCOUS HCL 2 % MT SOLN
15.0000 mL | Freq: Once | OROMUCOSAL | Status: DC
Start: 2022-03-22 — End: 2022-03-22

## 2022-03-22 MED ORDER — ALUM & MAG HYDROXIDE-SIMETH 200-200-20 MG/5ML PO SUSP: 30.0000 mL | Freq: Once | ORAL | Status: DC

## 2022-03-22 NOTE — ED Provider Triage Note (Addendum)
Emergency Medicine Provider Triage Evaluation Note  [name] , a 46 y.o. male  was evaluated in triage.  Pt complains of trouble swallowing. The patient reports that he is occasionally feels like he is choking on his food. Was seen in [place] yesterday with a CT and bloodwork and was told that he was supposed to see a GI doc but was discharged. He reports this has been going on for 2 days. H/o abdominal surgery at [facility]. Can tolerate liquids, just not solids.   Review of Systems  Positive:  Negative:   Physical Exam  BP (!) 133/100   Pulse 73   Temp 97.8 F (36.6 C) (Oral)   Resp 18   Ht 5\' 9"  (1.753 m)   Wt 89.8 kg   SpO2 96%   BMI 29.24 kg/m  Gen:   Awake, no distress   Resp:  Normal effort  MSK:   Moves extremities without difficulty  Other:  Controlling secretions, speaking in full sentences, air way patent  Medical Decision Making  Medically screening exam initiated at 9:51 AM.  Appropriate orders placed.  Cullen Lahaie was informed that the remainder of the evaluation will be completed by another provider, this initial triage assessment does not replace that evaluation, and the importance of remaining in the ED until their evaluation is complete.  From reading patient note from earlier today/yesterday. Baldo Ash was going to discharge him and give him GI follow up. Patient requested to be sent to [facility]. [facility] did not see a inpatient transfer was warranted and wanted the patient to be discharged home with GI cocktail supplies. Will order GI cocktail.    [facility], PA-C 03/22/22 0954    05/22/22, PA-C 03/22/22 [phone]

## 2022-03-22 NOTE — ED Triage Notes (Signed)
Reports having trouble swallowing and got choked on gabapentin last night.  Denies pain.  Went to [place] and nothing was done.

## 2022-03-22 NOTE — ED Notes (Signed)
Pt refused vitals and walked out during conversation with MD.

## 2022-03-22 NOTE — ED Provider Notes (Signed)
[facility] EMERGENCY DEPARTMENT Provider Note   CSN: [id number] Arrival date & time: 03/22/22  7048     History  Chief Complaint  Patient presents with   Dysphagia    [name] is a 46 y.o. male.  HPI 46 year old male presents today stating that he has been having difficulty swallowing for the past 2 days.  He feels like he is choking.  He is able to drink water.  He has been eating small amounts of food but states that it is not painful or actually having difficulty getting the food down but does feel like he is choking.  He was seen at [facility] last night.  He had a CT scan that he reports as being normal.  He states they kept him overnight and plan to discharge him today.  He had follow-up planned with his gastroenterologist at [facility].  He presents today for further evaluation. He denies any sore throat, throat pain, fever, chills, known masses, nausea, vomiting, or difficulty breathing.    Home Medications Prior to Admission medications   Medication Sig Start Date End Date Taking? Authorizing Provider  acetaminophen (TYLENOL) 325 MG tablet Take 2 tablets (650 mg total) by mouth every 6 (six) hours as needed. 11/07/21   Sloan Leiter, DO  alum & mag hydroxide-simeth (MAALOX MAX) 400-400-40 MG/5ML suspension Take 10 mLs by mouth every 6 (six) hours as needed for indigestion. 02/22/21   Caccavale, Sophia, PA-C  amLODipine (NORVASC) 5 MG tablet Take 5 mg by mouth daily.    [provider]  doxycycline (VIBRAMYCIN) 100 MG capsule Take 1 capsule (100 mg total) by mouth 2 (two) times daily. 11/07/21   Sloan Leiter, DO  escitalopram (LEXAPRO) 10 MG tablet Take 15 mg by mouth daily.    [provider]  gabapentin (NEURONTIN) 300 MG capsule Take 600 mg by mouth 3 (three) times daily.    [provider]  lidocaine (XYLOCAINE) 2 % solution Use as directed 15 mLs in the mouth or throat every 3 (three) hours as needed for mouth pain.  11/07/21   Sloan Leiter, DO  metoprolol tartrate (LOPRESSOR) 25 MG tablet Take 25 mg by mouth 2 (two) times daily.    [provider]  mirtazapine (REMERON SOL-TAB) 15 MG disintegrating tablet Take 7.5 mg by mouth at bedtime.    [provider]  nystatin (MYCOSTATIN) 100000 UNIT/ML suspension Take 5 mLs (500,000 Units total) by mouth 4 (four) times daily. 02/16/22   Carlisle Beers, FNP  ondansetron (ZOFRAN-ODT) 4 MG disintegrating tablet Take 1 tablet by mouth every 6 (six) hours as needed for nausea or vomiting.    [provider]  oxyCODONE (ROXICODONE) 5 MG immediate release tablet Take 1 tablet (5 mg total) by mouth every 4 (four) hours as needed for severe pain. 11/07/21   Sloan Leiter, DO  pantoprazole (PROTONIX) 40 MG tablet Take 40 mg by mouth 2 (two) times daily.    [provider]  OLANZapine (ZYPREXA) 10 MG tablet Take 10 mg by mouth at bedtime.  01/21/21  [provider]      Allergies    Antihistamines, chlorpheniramine-type; Latex; Olanzapine; Quetiapine; Alprazolam; Diazepam; Diphenhydramine hcl; Metoclopramide; Compazine [prochlorperazine]; Diphenhydramine; Lamotrigine; Quetiapine fumarate; and Risperidone    Review of Systems   Review of Systems  Physical Exam Updated Vital Signs BP (!) 138/103 (BP Location: Right Arm)   Pulse 73   Temp 98.2 F (36.8 C)   Resp 15  Ht 1.753 m (5\' 9" )   Wt 89.8 kg   SpO2 97%   BMI 29.24 kg/m  Physical Exam Vitals and nursing note reviewed.  Constitutional:      General: He is not in acute distress.    Appearance: Normal appearance. He is normal weight. He is not ill-appearing.  HENT:     Head: Normocephalic.     Right Ear: External ear normal.     Left Ear: External ear normal.     Nose: Nose normal.     Mouth/Throat:     Mouth: Mucous membranes are moist.     Pharynx: Oropharynx is clear.     Comments: Some whitish spots on the left tonsillar pillar most consistent with  tonsilloliths No redness, swelling, or exudate noted Floor of mouth palpated no masses noted  Eyes:     Extraocular Movements: Extraocular movements intact.     Pupils: Pupils are equal, round, and reactive to light.  Neck:     Comments: Neck trachea midline no significant adenopathy noted no masses or pain palpated Cardiovascular:     Rate and Rhythm: Normal rate and regular rhythm.  Pulmonary:     Effort: Pulmonary effort is normal.     Breath sounds: Normal breath sounds.  Abdominal:     General: Abdomen is flat. Bowel sounds are normal.     Palpations: Abdomen is soft.     Comments: Mild epigastric tenderness to palpation  Musculoskeletal:     Cervical back: Normal range of motion and neck supple.  Skin:    General: Skin is warm.     Capillary Refill: Capillary refill takes less than 2 seconds.  Neurological:     General: No focal deficit present.     Mental Status: He is alert.     Cranial Nerves: No cranial nerve deficit.     Sensory: No sensory deficit.     Motor: No weakness.     Coordination: Coordination normal.     Gait: Gait normal.     Comments: Patient drank water under my direct observation with no difficulty swallowing No lateralized weakness  Psychiatric:        Attention and Perception: Attention normal.        Mood and Affect: Affect is inappropriate.        Speech: He is noncommunicative.        Behavior: Behavior is agitated.        Cognition and Memory: Cognition normal.        Judgment: Judgment is impulsive.     ED Results / Procedures / Treatments   Labs (all labs ordered are listed, but only abnormal results are displayed) Labs Reviewed - No data to display  EKG None  Radiology No results found.  Procedures Procedures    Medications Ordered in ED Medications  alum & mag hydroxide-simeth (MAALOX/MYLANTA) 200-200-20 MG/5ML suspension 30 mL (has no administration in time range)  lidocaine (XYLOCAINE) 2 % viscous mouth solution 15 mL  (has no administration in time range)    ED Course/ Medical Decision Making/ A&P                           Medical Decision Making 47 year old male who presents initially complaining of dysphagia. Differential diagnosis includes but is not limited to mass, infection, reflux esophagitis, and CNS etiologies  Discussed plan to do CT with patient when patient stated that he had been down the last night  and had this done.  I am unable to access these records. With patient having had a recent CT which he reports is normal, mass and infection are lower on differential diagnosis.  Patient is not febrile and not having pain again decreasing likelihood of infectious etiology. Patient has normal neurological exam without focal deficits and doubt CNS etiology of aphasia Patient has a long history of reflux esophagitis and most likely this is his symptomatic esophagitis Advised patient that he should be evaluated outpatient by ENT or GI. Patient has a GI at [facility] and has seen them recently and has follow-up with them. Patient became agitated hostile and left during discussion           Final Clinical Impression(s) / ED Diagnoses Final diagnoses:  Oropharyngeal dysphagia    Rx / DC Orders ED Discharge Orders     None         Margarita Grizzle, MD 03/22/22 1529

## 2023-02-23 ENCOUNTER — Encounter (HOSPITAL_COMMUNITY): Payer: Self-pay

## 2023-02-23 ENCOUNTER — Emergency Department (HOSPITAL_COMMUNITY)
Admission: EM | Admit: 2023-02-23 | Discharge: 2023-02-23 | Disposition: A | Discharge status: Home / Self Care | Payer: Medicare Other | Attending: Emergency Medicine | Admitting: Emergency Medicine

## 2023-02-23 ENCOUNTER — Other Ambulatory Visit: Payer: Self-pay

## 2023-02-23 DIAGNOSIS — K0889 Other specified disorders of teeth and supporting structures: Secondary | ICD-10-CM | POA: Diagnosis not present

## 2023-02-23 DIAGNOSIS — Z91040 Latex allergy status: Secondary | ICD-10-CM | POA: Insufficient documentation

## 2023-02-23 MED ORDER — LIDOCAINE HCL URETHRAL/MUCOSAL 2 % EX GEL
1.0000 | Freq: Once | CUTANEOUS | Status: AC
  Administered 2023-02-23: 1 via TOPICAL
  Filled 2023-02-23: qty 11

## 2023-02-23 MED ORDER — KETOROLAC TROMETHAMINE 15 MG/ML IJ SOLN
15.0000 mg | Freq: Once | INTRAMUSCULAR | Status: AC
  Administered 2023-02-23: 15 mg via INTRAMUSCULAR
  Filled 2023-02-23: qty 1

## 2023-02-23 NOTE — Discharge Instructions (Addendum)
Your evaluation today has been reassuring.  There is no evidence for infection.  Your medication provided today should improve your condition until you have seen your physician or dentist tomorrow.  Return here for concerning changes in your condition.

## 2023-02-23 NOTE — ED Triage Notes (Addendum)
Pt came in via POV d/t having 9 teeth pulled 4 days ago & reports severe pain since then. Reports that he feels like he has a fever & denies any difficulty swallowing/breathing.

## 2023-02-23 NOTE — ED Provider Notes (Signed)
[facility] EMERGENCY DEPARTMENT AT [facility] Provider Note   CSN: [id number] Arrival date & time: 02/23/23  4098     History  Chief Complaint  Patient presents with   Dental Pain    [name] is a 47 y.o. male.  HPI Patient presents with turn of pain in his mouth.  40s ago the patient had 9 teeth pulled, in [place].  He notes that he has been taking Lortab, ibuprofen for relief but over the past few days has developed increasing pain throughout his mouth.  No new fever, vomiting, dyspnea,     Home Medications Prior to Admission medications   Medication Sig Start Date End Date Taking? Authorizing Provider  acetaminophen (TYLENOL) 325 MG tablet Take 2 tablets (650 mg total) by mouth every 6 (six) hours as needed. 11/07/21   Sloan Leiter, DO  alum & mag hydroxide-simeth (MAALOX MAX) 400-400-40 MG/5ML suspension Take 10 mLs by mouth every 6 (six) hours as needed for indigestion. 02/22/21   Caccavale, Sophia, PA-C  amLODipine (NORVASC) 5 MG tablet Take 5 mg by mouth daily.    [provider]  doxycycline (VIBRAMYCIN) 100 MG capsule Take 1 capsule (100 mg total) by mouth 2 (two) times daily. 11/07/21   Sloan Leiter, DO  escitalopram (LEXAPRO) 10 MG tablet Take 15 mg by mouth daily.    [provider]  gabapentin (NEURONTIN) 300 MG capsule Take 600 mg by mouth 3 (three) times daily.    [provider]  lidocaine (XYLOCAINE) 2 % solution Use as directed 15 mLs in the mouth or throat every 3 (three) hours as needed for mouth pain. 11/07/21   Sloan Leiter, DO  metoprolol tartrate (LOPRESSOR) 25 MG tablet Take 25 mg by mouth 2 (two) times daily.    [provider]  mirtazapine (REMERON SOL-TAB) 15 MG disintegrating tablet Take 7.5 mg by mouth at bedtime.    [provider]  nystatin (MYCOSTATIN) 100000 UNIT/ML suspension Take 5 mLs (500,000 Units total) by mouth 4 (four) times daily. 02/16/22   Carlisle Beers, FNP   ondansetron (ZOFRAN-ODT) 4 MG disintegrating tablet Take 1 tablet by mouth every 6 (six) hours as needed for nausea or vomiting.    [provider]  oxyCODONE (ROXICODONE) 5 MG immediate release tablet Take 1 tablet (5 mg total) by mouth every 4 (four) hours as needed for severe pain. 11/07/21   Sloan Leiter, DO  pantoprazole (PROTONIX) 40 MG tablet Take 40 mg by mouth 2 (two) times daily.    [provider]  OLANZapine (ZYPREXA) 10 MG tablet Take 10 mg by mouth at bedtime.  01/21/21  [provider]      Allergies    Antihistamines, chlorpheniramine-type; Latex; Olanzapine; Quetiapine; Alprazolam; Diazepam; Diphenhydramine hcl; Metoclopramide; Compazine [prochlorperazine]; Diphenhydramine; Lamotrigine; Quetiapine fumarate; and Risperidone    Review of Systems   Review of Systems  All other systems reviewed and are negative.   Physical Exam Updated Vital Signs BP (!) 136/109   Pulse 74   Temp 97.8 F (36.6 C) (Oral)   Resp 20   SpO2 92%  Physical Exam Vitals and nursing note reviewed.  Constitutional:      General: He is not in acute distress.    Appearance: He is well-developed. He is not ill-appearing, toxic-appearing or diaphoretic.  HENT:     Head: Normocephalic and atraumatic.     Mouth/Throat:   Eyes:     Conjunctiva/sclera: Conjunctivae normal.  Pulmonary:  Effort: Pulmonary effort is normal. No respiratory distress.     Breath sounds: No stridor.  Abdominal:     General: There is no distension.  Skin:    General: Skin is warm and dry.  Neurological:     Mental Status: He is alert and oriented to person, place, and time.     ED Results / Procedures / Treatments   Labs (all labs ordered are listed, but only abnormal results are displayed) Labs Reviewed - No data to display  EKG None  Radiology No results found.  Procedures Procedures    Medications Ordered in ED Medications  lidocaine (XYLOCAINE) 2 % jelly 1  Application (1 Application Topical Given 02/23/23 1013)  ketorolac (TORADOL) 15 MG/ML injection 15 mg (15 mg Intramuscular Given 02/23/23 1013)    ED Course/ Medical Decision Making/ A&P                             Medical Decision Making Adult male with chronic pain presents after having multiple teeth pulled few days ago, no severe pain.  Patient is mildly hypertensive, but not tachycardic, not febrile, and there is no physical exam evidence for infection, little suspicion for bacteremia, sepsis.  No evidence for airway compromise.  On reviewing his chart patient receives care in multiple other locations, [place], [place], and has ongoing pain issues. Here no evidence for distress, no evidence for decompensated state.  Patient received topical numbing medication, IM anti-inflammatories, and without evidence for distress, airway compromise, bacteremia, sepsis, was discharged to follow-up with his dentist tomorrow.  Amount and/or Complexity of Data Reviewed External Data Reviewed: notes.    Details: As above chart reviewed, notable for ongoing care.  Risk Prescription drug management. Decision regarding hospitalization. Diagnosis or treatment significantly limited by social determinants of health.  Final Clinical Impression(s) / ED Diagnoses Final diagnoses:  Pain, dental    Rx / DC Orders ED Discharge Orders     None         Gerhard Munch, MD 02/23/23 1028
